# Patient Record
Sex: Female | Born: 1985 | Race: Black or African American | Hispanic: No | Marital: Single | Smoking: Current every day smoker
Health system: Southern US, Community
[De-identification: ages and names within clinical notes are randomized; demographics above are authoritative.]

## PROBLEM LIST (undated history)

## (undated) ENCOUNTER — Inpatient Hospital Stay (HOSPITAL_COMMUNITY): Payer: Self-pay

## (undated) DIAGNOSIS — E669 Obesity, unspecified: Secondary | ICD-10-CM

## (undated) DIAGNOSIS — IMO0002 Reserved for concepts with insufficient information to code with codable children: Secondary | ICD-10-CM

## (undated) DIAGNOSIS — F32A Depression, unspecified: Secondary | ICD-10-CM

## (undated) DIAGNOSIS — F329 Major depressive disorder, single episode, unspecified: Secondary | ICD-10-CM

## (undated) DIAGNOSIS — R011 Cardiac murmur, unspecified: Secondary | ICD-10-CM

## (undated) DIAGNOSIS — R87619 Unspecified abnormal cytological findings in specimens from cervix uteri: Secondary | ICD-10-CM

## (undated) DIAGNOSIS — K219 Gastro-esophageal reflux disease without esophagitis: Secondary | ICD-10-CM

## (undated) DIAGNOSIS — F419 Anxiety disorder, unspecified: Secondary | ICD-10-CM

## (undated) DIAGNOSIS — D649 Anemia, unspecified: Secondary | ICD-10-CM

## (undated) HISTORY — PX: DILATION AND CURETTAGE OF UTERUS: SHX78

## (undated) HISTORY — DX: Obesity, unspecified: E66.9

---
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
## 2005-02-20 ENCOUNTER — Ambulatory Visit (HOSPITAL_COMMUNITY): Admission: RE | Admit: 2005-02-20 | Discharge: 2005-02-20 | Payer: Self-pay | Admitting: Obstetrics and Gynecology

## 2005-06-30 ENCOUNTER — Inpatient Hospital Stay (HOSPITAL_COMMUNITY): Admission: AD | Admit: 2005-06-30 | Discharge: 2005-07-02 | Payer: Self-pay | Admitting: Obstetrics and Gynecology

## 2005-07-08 ENCOUNTER — Inpatient Hospital Stay (HOSPITAL_COMMUNITY): Admission: AD | Admit: 2005-07-08 | Discharge: 2005-07-08 | Payer: Self-pay | Admitting: Obstetrics and Gynecology

## 2005-08-11 ENCOUNTER — Other Ambulatory Visit: Admission: RE | Admit: 2005-08-11 | Discharge: 2005-08-11 | Payer: Self-pay | Admitting: Obstetrics and Gynecology

## 2005-10-08 ENCOUNTER — Emergency Department (HOSPITAL_COMMUNITY): Admission: EM | Admit: 2005-10-08 | Discharge: 2005-10-08 | Payer: Self-pay | Admitting: Family Medicine

## 2006-05-01 ENCOUNTER — Emergency Department (HOSPITAL_COMMUNITY): Admission: EM | Admit: 2006-05-01 | Discharge: 2006-05-01 | Payer: Self-pay | Admitting: Family Medicine

## 2006-08-06 ENCOUNTER — Other Ambulatory Visit: Admission: RE | Admit: 2006-08-06 | Discharge: 2006-08-06 | Payer: Self-pay | Admitting: Obstetrics and Gynecology

## 2006-11-07 ENCOUNTER — Emergency Department (HOSPITAL_COMMUNITY): Admission: EM | Admit: 2006-11-07 | Discharge: 2006-11-07 | Payer: Self-pay | Admitting: Family Medicine

## 2006-12-27 ENCOUNTER — Other Ambulatory Visit: Admission: RE | Admit: 2006-12-27 | Discharge: 2006-12-27 | Payer: Self-pay | Admitting: Obstetrics and Gynecology

## 2006-12-29 ENCOUNTER — Inpatient Hospital Stay (HOSPITAL_COMMUNITY): Admission: AD | Admit: 2006-12-29 | Discharge: 2006-12-29 | Payer: Self-pay | Admitting: Obstetrics and Gynecology

## 2007-07-18 ENCOUNTER — Inpatient Hospital Stay (HOSPITAL_COMMUNITY): Admission: AD | Admit: 2007-07-18 | Discharge: 2007-07-18 | Payer: Self-pay | Admitting: Obstetrics & Gynecology

## 2007-08-09 ENCOUNTER — Ambulatory Visit (HOSPITAL_COMMUNITY): Admission: RE | Admit: 2007-08-09 | Discharge: 2007-08-09 | Payer: Self-pay | Admitting: Obstetrics & Gynecology

## 2007-08-23 ENCOUNTER — Ambulatory Visit (HOSPITAL_COMMUNITY): Admission: RE | Admit: 2007-08-23 | Discharge: 2007-08-23 | Payer: Self-pay | Admitting: Obstetrics & Gynecology

## 2007-09-02 ENCOUNTER — Inpatient Hospital Stay (HOSPITAL_COMMUNITY): Admission: AD | Admit: 2007-09-02 | Discharge: 2007-09-02 | Payer: Self-pay | Admitting: Obstetrics & Gynecology

## 2007-09-12 ENCOUNTER — Other Ambulatory Visit: Payer: Self-pay | Admitting: Emergency Medicine

## 2007-09-13 ENCOUNTER — Inpatient Hospital Stay (HOSPITAL_COMMUNITY): Admission: AD | Admit: 2007-09-13 | Discharge: 2007-09-13 | Payer: Self-pay | Admitting: Obstetrics & Gynecology

## 2007-09-13 ENCOUNTER — Other Ambulatory Visit: Payer: Self-pay | Admitting: Emergency Medicine

## 2007-11-05 ENCOUNTER — Ambulatory Visit (HOSPITAL_COMMUNITY): Admission: RE | Admit: 2007-11-05 | Discharge: 2007-11-05 | Payer: Self-pay | Admitting: Obstetrics & Gynecology

## 2007-12-13 ENCOUNTER — Inpatient Hospital Stay (HOSPITAL_COMMUNITY): Admission: AD | Admit: 2007-12-13 | Discharge: 2007-12-13 | Payer: Self-pay | Admitting: Obstetrics & Gynecology

## 2008-01-01 ENCOUNTER — Inpatient Hospital Stay (HOSPITAL_COMMUNITY): Admission: AD | Admit: 2008-01-01 | Discharge: 2008-01-04 | Payer: Self-pay | Admitting: Obstetrics & Gynecology

## 2008-02-16 ENCOUNTER — Emergency Department (HOSPITAL_COMMUNITY): Admission: EM | Admit: 2008-02-16 | Discharge: 2008-02-16 | Payer: Self-pay | Admitting: Family Medicine

## 2008-07-20 ENCOUNTER — Emergency Department (HOSPITAL_COMMUNITY): Admission: EM | Admit: 2008-07-20 | Discharge: 2008-07-20 | Payer: Self-pay | Admitting: Emergency Medicine

## 2010-01-07 ENCOUNTER — Emergency Department (HOSPITAL_COMMUNITY): Admission: EM | Admit: 2010-01-07 | Discharge: 2010-01-07 | Payer: Self-pay | Admitting: Family Medicine

## 2011-02-21 NOTE — H&P (Signed)
NAME:  Robin Hodge, Robin Hodge NO.:  [id number]   MEDICAL RECORD NO.:  [id number]          PATIENT TYPE:  INP   LOCATION:  9111                          FACILITY:  [facility]   PHYSICIAN:  Roseanna Rainbow, M.D.DATE OF BIRTH:  10-04-1986   DATE OF ADMISSION:  01/01/2008  DATE OF DISCHARGE:                              HISTORY & PHYSICAL   CHIEF COMPLAINT:  The patient is a 25 year old, para 1 with an estimated  date of confinement of April 2 for an elective induction of labor.   HISTORY OF PRESENT ILLNESS:  Please see the above.   ALLERGIES:  No known drug allergies.   MEDICATIONS:  Prenatal vitamins.   OB RISK FACTORS:  Urinary tract infection, Rh negative, non-sensitized  obesity. History of chronic hypertension.   PRENATAL SCREENS:  Chlamydia probe negative.  Urine culture and  sensitivity October 2008, Proteus mirabilis, quad screen negative.  Pap  smear negative.  GC probe negative. Hepatitis B surface antigen  negative, hematocrit 33.8, hemoglobin 10.5, HIV nonreactive.  Blood type  is O negative, antibody screen negative, RPR nonreactive, rubella  immune. Sickle cell negative.  Varicella immune.  GBS negative on March  6.   PAST OBSTETRICAL HISTORY:  There is a history of a spontaneous abortion.  In September 2006 she was delivered of delivered of a live born female  post dates 7 pounds 12 ounces vaginal delivery.   PAST GYN HISTORY:  Noncontributory.   PAST MEDICAL HISTORY:  Iron deficiency anemia.  Hypertension.  No  medications at present.   PAST SURGICAL HISTORY:  No previous surgery.   SOCIAL HISTORY:  She is employed as a Conservation officer, nature at [employer] and she is a  Consulting civil engineer.  She is single, does not give any significant history of  alcohol usage, has no significant smoking history.  Denies illicit drug  use. There is family history of heart disease, diabetes, hypertension.   PHYSICAL EXAM:  VITAL SIGNS:  Stable, afebrile.  Fetal heart tracing  reassuring.   Tocodynamometer irregular, uterine contractions. Sterile  vaginal exam. Per the RN, the cervix is 1-2 cm dilated, 60-70% effaced  with the vertex at a -2 station. .   ASSESSMENT:  Primipara at term with a history of chronic hypertension on  no medications at present.  Favorable Bishop score.  Fetal heart tracing  consistent with fetal well-being.   PLAN:  Low-dose Pitocin per protocol.      Roseanna Rainbow, M.D.  Electronically Signed     LAJ/MEDQ  D:  01/01/2008  T:  01/02/2008  Job:  [id number]

## 2011-02-24 NOTE — Discharge Summary (Signed)
NAME:  Robin Hodge, Robin Hodge NO.:  [id number]   MEDICAL RECORD NO.:  [id number]          PATIENT TYPE:  INP   LOCATION:  9103                          FACILITY:  [facility]   PHYSICIAN:  Rudy Jew. Ashley Royalty, M.D.DATE OF BIRTH:  01-Aug-1986   DATE OF ADMISSION:  06/30/2005  DATE OF DISCHARGE:  07/02/2005                                 DISCHARGE SUMMARY   DISCHARGE DIAGNOSES:  1.  Intrauterine pregnancy at term, delivered.  2.  Labor.  3.  Term born living child, vertex.   OPERATIONS AND SPECIAL PROCEDURES:  OB deliver with right vaginal laceration  per Dr. Mia Creek.   CONSULTATIONS:  None.   DISCHARGE MEDICATIONS:  Vicodin.   HISTORY AND PHYSICAL:  An 25 year old primigravida, [facility] June 25, 2005,  presenting complaining of contractions.  Initial cervical examination was 6  cm, 90%, -2 station, vertex presentation.  For the remainder of the History  and Physical, please see chart.   HOSPITAL COURSE:  The patient was admitted to [facility].  Admission laboratory tests were drawn.  On June 30, 2005,  she delivered a 7 pound 14 ounce female, Apgars 9 at 1 minute and 9 at 5  minutes, sent to newborn nursery.  Delivery was accomplished per Dr. Alfonzo Feller over an intact perineum. The patient did sustain a right vaginal  laceration which was repaired without difficulty.  The patient's postpartum  course was benign.  She was discharged on second postpartum day afebrile and  in satisfactory condition.   DISPOSITION:  The patient will return to [facility]  in 4 to 6 weeks for postpartum evaluation.           ______________________________  Rudy Jew Ashley Royalty, M.D.     JAM/MEDQ  D:  08/01/2005  T:  08/01/2005  Job:  [id number]

## 2011-05-09 ENCOUNTER — Emergency Department (HOSPITAL_COMMUNITY)
Admission: EM | Admit: 2011-05-09 | Discharge: 2011-05-09 | Disposition: A | Discharge status: Home / Self Care | Payer: Self-pay | Attending: Emergency Medicine | Admitting: Emergency Medicine

## 2011-05-09 ENCOUNTER — Emergency Department (HOSPITAL_COMMUNITY): Payer: Self-pay

## 2011-05-09 DIAGNOSIS — R42 Dizziness and giddiness: Secondary | ICD-10-CM | POA: Insufficient documentation

## 2011-05-09 DIAGNOSIS — R509 Fever, unspecified: Secondary | ICD-10-CM | POA: Insufficient documentation

## 2011-05-09 DIAGNOSIS — J45909 Unspecified asthma, uncomplicated: Secondary | ICD-10-CM | POA: Insufficient documentation

## 2011-05-09 DIAGNOSIS — R05 Cough: Secondary | ICD-10-CM | POA: Insufficient documentation

## 2011-05-09 DIAGNOSIS — R0602 Shortness of breath: Secondary | ICD-10-CM | POA: Insufficient documentation

## 2011-05-09 DIAGNOSIS — J3489 Other specified disorders of nose and nasal sinuses: Secondary | ICD-10-CM | POA: Insufficient documentation

## 2011-05-09 DIAGNOSIS — R079 Chest pain, unspecified: Secondary | ICD-10-CM | POA: Insufficient documentation

## 2011-05-09 DIAGNOSIS — R059 Cough, unspecified: Secondary | ICD-10-CM | POA: Insufficient documentation

## 2011-05-09 DIAGNOSIS — J40 Bronchitis, not specified as acute or chronic: Secondary | ICD-10-CM | POA: Insufficient documentation

## 2011-05-14 ENCOUNTER — Emergency Department (HOSPITAL_COMMUNITY)
Admission: EM | Admit: 2011-05-14 | Discharge: 2011-05-15 | Disposition: A | Discharge status: Home / Self Care | Payer: Self-pay | Attending: Emergency Medicine | Admitting: Emergency Medicine

## 2011-05-14 DIAGNOSIS — L03019 Cellulitis of unspecified finger: Secondary | ICD-10-CM | POA: Insufficient documentation

## 2011-05-14 DIAGNOSIS — M7989 Other specified soft tissue disorders: Secondary | ICD-10-CM | POA: Insufficient documentation

## 2011-05-14 DIAGNOSIS — M79609 Pain in unspecified limb: Secondary | ICD-10-CM | POA: Insufficient documentation

## 2011-07-03 LAB — CBC
HCT: 29 — ABNORMAL LOW
MCHC: 32.9
MCV: 71.4 — ABNORMAL LOW
RBC: 3.72 — ABNORMAL LOW
RBC: 4
RDW: 18.5 — ABNORMAL HIGH

## 2011-07-03 LAB — RH IMMUNE GLOBULIN WORKUP (NOT WOMEN'S HOSP): ABO/RH(D): O NEG

## 2011-07-03 LAB — RH IMMUNE GLOB WKUP(>/=20WKS)(NOT WOMEN'S HOSP): Fetal Screen: NEGATIVE

## 2011-07-03 LAB — RPR: RPR Ser Ql: NONREACTIVE

## 2011-07-17 LAB — KLEIHAUER-BETKE STAIN: Fetal Cells %: 0

## 2011-07-18 LAB — URINE MICROSCOPIC-ADD ON

## 2011-07-18 LAB — URINALYSIS, ROUTINE W REFLEX MICROSCOPIC
Ketones, ur: NEGATIVE
Protein, ur: 100 — AB
Urobilinogen, UA: 0.2

## 2011-07-18 LAB — URINE CULTURE: Colony Count: >=100000

## 2011-07-18 LAB — WET PREP, GENITAL
Clue Cells Wet Prep HPF POC: NONE SEEN
Trich, Wet Prep: NONE SEEN
Yeast Wet Prep HPF POC: NONE SEEN

## 2011-07-20 LAB — GC/CHLAMYDIA PROBE AMP, GENITAL: Chlamydia, DNA Probe: NEGATIVE

## 2011-07-20 LAB — URINALYSIS, ROUTINE W REFLEX MICROSCOPIC
Bilirubin Urine: NEGATIVE
Glucose, UA: NEGATIVE
Hgb urine dipstick: NEGATIVE
Ketones, ur: NEGATIVE
Protein, ur: NEGATIVE
Urobilinogen, UA: 0.2

## 2011-07-20 LAB — WET PREP, GENITAL: WBC, Wet Prep HPF POC: NONE SEEN

## 2011-08-02 ENCOUNTER — Inpatient Hospital Stay (HOSPITAL_COMMUNITY)
Admission: AD | Admit: 2011-08-02 | Discharge: 2011-08-03 | Disposition: A | Discharge status: Home / Self Care | Payer: Self-pay | Source: Ambulatory Visit | Attending: Obstetrics and Gynecology | Admitting: Obstetrics and Gynecology

## 2011-08-02 ENCOUNTER — Encounter (HOSPITAL_COMMUNITY): Payer: Self-pay | Admitting: *Deleted

## 2011-08-02 DIAGNOSIS — A499 Bacterial infection, unspecified: Secondary | ICD-10-CM

## 2011-08-02 DIAGNOSIS — B9689 Other specified bacterial agents as the cause of diseases classified elsewhere: Secondary | ICD-10-CM | POA: Insufficient documentation

## 2011-08-02 DIAGNOSIS — N760 Acute vaginitis: Secondary | ICD-10-CM | POA: Insufficient documentation

## 2011-08-02 DIAGNOSIS — R300 Dysuria: Secondary | ICD-10-CM | POA: Insufficient documentation

## 2011-08-02 LAB — URINALYSIS, ROUTINE W REFLEX MICROSCOPIC
Glucose, UA: NEGATIVE mg/dL
Hgb urine dipstick: NEGATIVE
Specific Gravity, Urine: >1.03 — ABNORMAL HIGH (ref 1.005–1.030)
pH: 5.5 (ref 5.0–8.0)

## 2011-08-02 NOTE — Progress Notes (Signed)
Pt reports frequency x 1 week, pain and pressure with urination. Vaginal discharge x 2 weeks, mucus with odor

## 2011-08-02 NOTE — ED Provider Notes (Signed)
History   Pt presents today c/o dysuria and vag dc for 1wk. She states the pain has worsened over the past several days. She denies fever, vag bleeding, abd pain, or any other sx. Her last episode of intercourse was 1wk ago.  Chief Complaint  Patient presents with  . Dysuria   HPI  OB History    Grav Para Term Preterm Abortions TAB SAB Ect Mult Living                  History reviewed. No pertinent past medical history.  No past surgical history on file.  No family history on file.  History  Substance Use Topics  . Smoking status: Not on file  . Smokeless tobacco: Not on file  . Alcohol Use: Not on file    Allergies: Allergies not on file  No prescriptions prior to admission    Review of Systems  Constitutional: Negative for fever.  Cardiovascular: Negative for chest pain.  Gastrointestinal: Negative for nausea, vomiting, abdominal pain, diarrhea, constipation and blood in stool.  Genitourinary: Positive for dysuria. Negative for urgency, frequency, hematuria and flank pain.  Neurological: Negative for dizziness and headaches.  Psychiatric/Behavioral: Negative for depression and suicidal ideas.   Physical Exam   Blood pressure 142/66, pulse 84, temperature 98.7 F (37.1 C), resp. rate 20, height 5' 7.5" (1.715 m), weight 309 lb (140.161 kg), last menstrual period 07/20/2011, SpO2 99.00%.  Physical Exam  Nursing note and vitals reviewed. Constitutional: She is oriented to person, place, and time. She appears well-developed and well-nourished. No distress.  HENT:  Head: Normocephalic and atraumatic.  Eyes: EOM are normal. Pupils are equal, round, and reactive to light.  GI: Soft. She exhibits no distension. There is no tenderness. There is no rebound and no guarding.  Genitourinary: No bleeding around the vagina. Vaginal discharge found.       Bimanual exam difficult secondary to morbid obesity.  Neurological: She is alert and oriented to person, place, and time.    Skin: Skin is warm and dry. She is not diaphoretic.  Psychiatric: She has a normal mood and affect. Her behavior is normal. Judgment and thought content normal.    MAU Course  Procedures  Results for orders placed during the hospital encounter of 08/02/11 (from the past 24 hour(s))  URINALYSIS, ROUTINE W REFLEX MICROSCOPIC     Status: Abnormal   Collection Time   08/02/11 10:30 PM      Component Value Range   Color, Urine YELLOW  YELLOW    Appearance CLEAR  CLEAR    Specific Gravity, Urine >1.030 (*) 1.005 - 1.030    pH 5.5  5.0 - 8.0    Glucose, UA NEGATIVE  NEGATIVE (mg/dL)   Hgb urine dipstick NEGATIVE  NEGATIVE    Bilirubin Urine NEGATIVE  NEGATIVE    Ketones, ur NEGATIVE  NEGATIVE (mg/dL)   Protein, ur NEGATIVE  NEGATIVE (mg/dL)   Urobilinogen, UA 0.2  0.0 - 1.0 (mg/dL)   Nitrite NEGATIVE  NEGATIVE    Leukocytes, UA NEGATIVE  NEGATIVE   WET PREP, GENITAL     Status: Abnormal   Collection Time   08/02/11 11:45 PM      Component Value Range   Yeast, Wet Prep NONE SEEN  NONE SEEN    Trich, Wet Prep NONE SEEN  NONE SEEN    Clue Cells, Wet Prep MANY (*) NONE SEEN    WBC, Wet Prep HPF POC MODERATE (*) NONE SEEN  Assessment and Plan  BV: discussed with pt at length. Will tx with Flagyl. Warned of antabuse reaction. She will f/u with her PCP. Discussed diet, activity, risks, and precautions.  Clinton Gallant. Rice III, DrHSc, MPAS, PA-C  08/02/2011, 11:44 PM   Henrietta Hoover, PA 08/03/11 0011

## 2011-08-03 ENCOUNTER — Encounter (HOSPITAL_COMMUNITY): Payer: Self-pay | Admitting: *Deleted

## 2011-08-03 LAB — WET PREP, GENITAL: Trich, Wet Prep: NONE SEEN

## 2011-08-03 LAB — GC/CHLAMYDIA PROBE AMP, GENITAL: GC Probe Amp, Genital: NEGATIVE

## 2011-08-03 MED ORDER — METRONIDAZOLE 500 MG PO TABS: 500.0000 mg | ORAL_TABLET | Freq: Two times a day (BID) | ORAL | Status: AC

## 2011-09-08 ENCOUNTER — Emergency Department (HOSPITAL_COMMUNITY)
Admission: EM | Admit: 2011-09-08 | Discharge: 2011-09-08 | Disposition: A | Discharge status: Home / Self Care | Payer: Self-pay | Attending: Emergency Medicine | Admitting: Emergency Medicine

## 2011-09-08 ENCOUNTER — Encounter (HOSPITAL_COMMUNITY): Payer: Self-pay | Admitting: Emergency Medicine

## 2011-09-08 DIAGNOSIS — R059 Cough, unspecified: Secondary | ICD-10-CM | POA: Insufficient documentation

## 2011-09-08 DIAGNOSIS — IMO0001 Reserved for inherently not codable concepts without codable children: Secondary | ICD-10-CM | POA: Insufficient documentation

## 2011-09-08 DIAGNOSIS — R220 Localized swelling, mass and lump, head: Secondary | ICD-10-CM | POA: Insufficient documentation

## 2011-09-08 DIAGNOSIS — R221 Localized swelling, mass and lump, neck: Secondary | ICD-10-CM | POA: Insufficient documentation

## 2011-09-08 DIAGNOSIS — R05 Cough: Secondary | ICD-10-CM | POA: Insufficient documentation

## 2011-09-08 DIAGNOSIS — J069 Acute upper respiratory infection, unspecified: Secondary | ICD-10-CM

## 2011-09-08 DIAGNOSIS — J3489 Other specified disorders of nose and nasal sinuses: Secondary | ICD-10-CM | POA: Insufficient documentation

## 2011-09-08 DIAGNOSIS — R070 Pain in throat: Secondary | ICD-10-CM | POA: Insufficient documentation

## 2011-09-08 MED ORDER — IBUPROFEN 800 MG PO TABS
800.0000 mg | ORAL_TABLET | Freq: Once | ORAL | Status: AC
  Administered 2011-09-08: 800 mg via ORAL
  Filled 2011-09-08 (×2): qty 1

## 2011-09-08 NOTE — ED Notes (Signed)
Pt c/o generalized body aches x several days; pt sts some head congestion and cough; pt denies fever

## 2011-09-08 NOTE — ED Provider Notes (Signed)
History     CSN: [id number] Arrival date & time: 09/08/2011 10:45 AM   First MD Initiated Contact with Patient 09/08/11 1153      Chief Complaint  Patient presents with  . Generalized Body Aches    (Consider location/radiation/quality/duration/timing/severity/associated sxs/prior treatment) Patient is a 25 y.o. female presenting with URI. The history is provided by the patient.  URI The primary symptoms include sore throat, cough and myalgias. Primary symptoms do not include fever, headaches, abdominal pain, nausea, vomiting, arthralgias or rash. The current episode started yesterday. This is a new problem.  The sore throat began yesterday. The sore throat has been unchanged since its onset. The sore throat is moderate in intensity. The sore throat is not accompanied by trouble swallowing, drooling, hoarse voice or stridor.  The cough is non-productive.  Myalgias began yesterday. The myalgias have been unchanged since their onset. The myalgias are generalized. The discomfort from the myalgias is moderate. The myalgias are not associated with weakness or swelling. Risk factors for myalgias include a change in medications.  The onset of the illness is associated with exposure to sick contacts and recent travel. Symptoms associated with the illness include facial pain, sinus pressure, congestion and rhinorrhea. The illness is not associated with chills. The following treatments were addressed: Acetaminophen was ineffective. A decongestant was ineffective.    Past Medical History  Diagnosis Date  . No pertinent past medical history     Past Surgical History  Procedure Date  . No past surgeries     History reviewed. No pertinent family history.  History  Substance Use Topics  . Smoking status: Never Smoker   . Smokeless tobacco: Not on file  . Alcohol Use: No    OB History    Grav Para Term Preterm Abortions TAB SAB Ect Mult Living   3 2        2       Review of Systems    Constitutional: Negative for fever and chills.  HENT: Positive for congestion, sore throat, rhinorrhea and sinus pressure. Negative for hoarse voice, drooling and trouble swallowing.   Respiratory: Positive for cough. Negative for stridor.   Cardiovascular: Negative for chest pain.  Gastrointestinal: Negative for nausea, vomiting and abdominal pain.  Musculoskeletal: Positive for myalgias. Negative for arthralgias.  Skin: Negative for rash.  Neurological: Negative for weakness and headaches.    Allergies  Review of patient's allergies indicates no known allergies.  Home Medications  No current outpatient prescriptions on file.  BP 133/74  Pulse 92  Temp(Src) 98.6 F (37 C) (Oral)  Resp 16  SpO2 99%  Physical Exam  Nursing note and vitals reviewed. Constitutional: She is oriented to person, place, and time. She appears well-developed and well-nourished. No distress.  HENT:  Head: Normocephalic and atraumatic.  Right Ear: External ear normal.  Left Ear: External ear normal.  Nose: Mucosal edema and rhinorrhea present. No nasal deformity or nasal septal hematoma. No epistaxis. Right sinus exhibits maxillary sinus tenderness. Left sinus exhibits maxillary sinus tenderness.  Mouth/Throat: No oropharyngeal exudate.  Eyes: Pupils are equal, round, and reactive to light.  Neck: Normal range of motion. Neck supple.  Pulmonary/Chest: Effort normal and breath sounds normal.  Abdominal: Soft.  Musculoskeletal: Normal range of motion.  Lymphadenopathy:    She has no cervical adenopathy.  Neurological: She is alert and oriented to person, place, and time.  Skin: Skin is warm and dry. No rash noted. She is not diaphoretic.    ED Course  Procedures (including critical care time)  Labs Reviewed - No data to display No results found.   No diagnosis found.    MDM    Pt is afebrile, no fever at home.  Sat on RA is 99% and normal.  No wheezing, stridor.  Pt told it is a URI,  will need to run course, recommend OTC meds, ibuprofen for achiness and she should feel improved over the weekend, will give work note.          Gavin Pound. Oletta Lamas, MD 09/08/11 1212

## 2012-03-16 ENCOUNTER — Inpatient Hospital Stay (HOSPITAL_COMMUNITY)
Admission: AD | Admit: 2012-03-16 | Discharge: 2012-03-17 | Disposition: A | Discharge status: Home / Self Care | Payer: Medicaid Other | Source: Ambulatory Visit | Attending: Obstetrics & Gynecology | Admitting: Obstetrics & Gynecology

## 2012-03-16 ENCOUNTER — Encounter (HOSPITAL_COMMUNITY): Payer: Self-pay | Admitting: *Deleted

## 2012-03-16 DIAGNOSIS — O26899 Other specified pregnancy related conditions, unspecified trimester: Secondary | ICD-10-CM

## 2012-03-16 DIAGNOSIS — O99891 Other specified diseases and conditions complicating pregnancy: Secondary | ICD-10-CM | POA: Insufficient documentation

## 2012-03-16 DIAGNOSIS — R109 Unspecified abdominal pain: Secondary | ICD-10-CM | POA: Insufficient documentation

## 2012-03-16 LAB — URINALYSIS, ROUTINE W REFLEX MICROSCOPIC
Bilirubin Urine: NEGATIVE
Ketones, ur: NEGATIVE mg/dL
Nitrite: NEGATIVE
Urobilinogen, UA: 0.2 mg/dL (ref 0.0–1.0)

## 2012-03-16 NOTE — MAU Note (Signed)
Pt states abdominal pain started 3 days ago. Pt states it feels like " somebody stab the bottom of her stomach"

## 2012-03-16 NOTE — MAU Note (Signed)
Pt states, " I've had pain in my low abdomen for three days. I sleep most of the day and took tylenol and it helped for a while."

## 2012-03-17 ENCOUNTER — Inpatient Hospital Stay (HOSPITAL_COMMUNITY): Payer: Medicaid Other

## 2012-03-17 DIAGNOSIS — R109 Unspecified abdominal pain: Secondary | ICD-10-CM

## 2012-03-17 DIAGNOSIS — O26899 Other specified pregnancy related conditions, unspecified trimester: Secondary | ICD-10-CM

## 2012-03-17 LAB — CBC
HCT: 27.8 % — ABNORMAL LOW (ref 36.0–46.0)
Hemoglobin: 8.2 g/dL — ABNORMAL LOW (ref 12.0–15.0)
MCH: 19.3 pg — ABNORMAL LOW (ref 26.0–34.0)
MCHC: 29.5 g/dL — ABNORMAL LOW (ref 30.0–36.0)
RBC: 4.24 MIL/uL (ref 3.87–5.11)

## 2012-03-17 NOTE — Discharge Instructions (Signed)
Abdominal Pain During Pregnancy Abdominal discomfort is common in pregnancy. Most of the time, it does not cause harm. There are many causes of abdominal pain. Some causes are more serious than others. Some of the causes of abdominal pain in pregnancy are easily diagnosed. Occasionally, the diagnosis takes time to understand. Other times, the cause is not determined. Abdominal pain can be a sign that something is very wrong with the pregnancy, or the pain may have nothing to do with the pregnancy at all. For this reason, always tell your caregiver if you have any abdominal discomfort. CAUSES Common and harmless causes of abdominal pain include:  Constipation.   Excess gas and bloating.   Round ligament pain. This is pain that is felt in the folds of the groin.   The position the baby or placenta is in.   Baby kicks.   Braxton-Hicks contractions. These are mild contractions that do not cause cervical dilation.  Serious causes of abdominal pain include:  Ectopic pregnancy. This happens when a fertilized egg implants outside of the uterus.   Miscarriage.   Preterm labor. This is when labor starts at less than 37 weeks of pregnancy.   Placental abruption. This is when the placenta partially or completely separates from the uterus.   Preeclampsia. This is often associated with high blood pressure and has been referred to as "toxemia in pregnancy."   Uterine or amniotic fluid infections.  Causes unrelated to pregnancy include:  Urinary tract infection.   Gallbladder stones or inflammation.   Hepatitis or other liver illness.   Intestinal problems, stomach flu, food poisoning, or ulcer.   Appendicitis.   Kidney (renal) stones.   Kidney infection (pylonephritis).  HOME CARE INSTRUCTIONS  For mild pain:  Do not have sexual intercourse or put anything in your vagina until your symptoms go away completely.   Get plenty of rest until your pain improves. If your pain does not  improve in 1 hour, call your caregiver.   Drink clear fluids if you feel nauseous. Avoid solid food as long as you are uncomfortable or nauseous.   Only take medicine as directed by your caregiver.   Keep all follow-up appointments with your caregiver.  SEEK IMMEDIATE MEDICAL CARE IF:  You are bleeding, leaking fluid, or passing tissue from the vagina.   You have increasing pain or cramping.   You have persistent vomiting.   You have painful or bloody urination.   You have a fever.   You notice a decrease in your baby's movements.   You have extreme weakness or feel faint.   You have shortness of breath, with or without abdominal pain.   You develop a severe headache with abdominal pain.   You have abnormal vaginal discharge with abdominal pain.   You have persistent diarrhea.   You have abdominal pain that continues even after rest, or gets worse.  MAKE SURE YOU:   Understand these instructions.   Will watch your condition.   Will get help right away if you are not doing well or get worse.  Document Released: 09/25/2005 Document Revised: 09/14/2011 Document Reviewed: 04/21/2011 ExitCare Patient Information 2012 [employer].Abdominal Pain During Pregnancy Abdominal discomfort is common in pregnancy. Most of the time, it does not cause harm. There are many causes of abdominal pain. Some causes are more serious than others. Some of the causes of abdominal pain in pregnancy are easily diagnosed. Occasionally, the diagnosis takes time to understand. Other times, the cause is not determined.   Abdominal pain can be a sign that something is very wrong with the pregnancy, or the pain may have nothing to do with the pregnancy at all. For this reason, always tell your caregiver if you have any abdominal discomfort. CAUSES Common and harmless causes of abdominal pain include:  Constipation.   Excess gas and bloating.   Round ligament pain. This is pain that is felt in the  folds of the groin.   The position the baby or placenta is in.   Baby kicks.   Braxton-Hicks contractions. These are mild contractions that do not cause cervical dilation.  Serious causes of abdominal pain include:  Ectopic pregnancy. This happens when a fertilized egg implants outside of the uterus.   Miscarriage.   Preterm labor. This is when labor starts at less than 37 weeks of pregnancy.   Placental abruption. This is when the placenta partially or completely separates from the uterus.   Preeclampsia. This is often associated with high blood pressure and has been referred to as "toxemia in pregnancy."   Uterine or amniotic fluid infections.  Causes unrelated to pregnancy include:  Urinary tract infection.   Gallbladder stones or inflammation.   Hepatitis or other liver illness.   Intestinal problems, stomach flu, food poisoning, or ulcer.   Appendicitis.   Kidney (renal) stones.   Kidney infection (pylonephritis).  HOME CARE INSTRUCTIONS  For mild pain:  Do not have sexual intercourse or put anything in your vagina until your symptoms go away completely.   Get plenty of rest until your pain improves. If your pain does not improve in 1 hour, call your caregiver.   Drink clear fluids if you feel nauseous. Avoid solid food as long as you are uncomfortable or nauseous.   Only take medicine as directed by your caregiver.   Keep all follow-up appointments with your caregiver.  SEEK IMMEDIATE MEDICAL CARE IF:  You are bleeding, leaking fluid, or passing tissue from the vagina.   You have increasing pain or cramping.   You have persistent vomiting.   You have painful or bloody urination.   You have a fever.   You notice a decrease in your baby's movements.   You have extreme weakness or feel faint.   You have shortness of breath, with or without abdominal pain.   You develop a severe headache with abdominal pain.   You have abnormal vaginal discharge  with abdominal pain.   You have persistent diarrhea.   You have abdominal pain that continues even after rest, or gets worse.  MAKE SURE YOU:   Understand these instructions.   Will watch your condition.   Will get help right away if you are not doing well or get worse.  Document Released: 09/25/2005 Document Revised: 09/14/2011 Document Reviewed: 04/21/2011 ExitCare Patient Information 2012 [employer]. 

## 2012-03-17 NOTE — MAU Provider Note (Signed)
Robin Hodge y.o.G3P2 @4 .3 weeks by LMP Chief Complaint  Patient presents with  . Abdominal Pain     First Provider Initiated Contact with Patient 03/17/12 0105      SUBJECTIVE  HPI: HPI: Robin Hodge is a 26 y.o. year old G3P2 female at 4.[redacted] weeks gestation who presents to MAU reporting intermittent, sharp low abd pain x 3 days. Denies VB, vaginal discharge, urinary Sx, GI complaints. Has not taken UPT. Patient's last menstrual period was 02/15/2012.    Past Medical History  Diagnosis Date  . No pertinent past medical history    Past Surgical History  Procedure Date  . No past surgeries    History   Social History  . Marital Status: Single    Spouse Name: N/A    Number of Children: N/A  . Years of Education: N/A   Occupational History  . Not on file.   Social History Main Topics  . Smoking status: Never Smoker   . Smokeless tobacco: Not on file  . Alcohol Use: No  . Drug Use: No  . Sexually Active: Yes    Birth Control/ Protection: None   Other Topics Concern  . Not on file   Social History Narrative  . No narrative on file   No current facility-administered medications on file prior to encounter.   No current outpatient prescriptions on file prior to encounter.   No Known Allergies  ROS: Pertinent items in HPI  OBJECTIVE Blood pressure 137/86, pulse 106, temperature 98.7 F (37.1 C), temperature source Oral, resp. rate 20, height 5\' 7"  (1.702 m), weight 145.605 kg (321 lb), last menstrual period 02/15/2012.  GENERAL: Well-developed, well-nourished female in no acute distress.  HEENT: Normocephalic, good dentition HEART: normal rate RESP: normal effort ABDOMEN: Soft, nontender EXTREMITIES: Nontender, no edema NEURO: Alert and oriented SPECULUM EXAM: Declined  LAB RESULTS Results for orders placed during the hospital encounter of 03/16/12 (from the past 24 hour(s))  URINALYSIS, ROUTINE W REFLEX MICROSCOPIC     Status: Abnormal   Collection Time    03/16/12 11:20 PM      Component Value Range   Color, Urine YELLOW  YELLOW    APPearance CLEAR  CLEAR    Specific Gravity, Urine >1.030 (*) 1.005 - 1.030    pH 6.0  5.0 - 8.0    Glucose, UA NEGATIVE  NEGATIVE (mg/dL)   Hgb urine dipstick NEGATIVE  NEGATIVE    Bilirubin Urine NEGATIVE  NEGATIVE    Ketones, ur NEGATIVE  NEGATIVE (mg/dL)   Protein, ur NEGATIVE  NEGATIVE (mg/dL)   Urobilinogen, UA 0.2  0.0 - 1.0 (mg/dL)   Nitrite NEGATIVE  NEGATIVE    Leukocytes, UA NEGATIVE  NEGATIVE   POCT PREGNANCY, URINE     Status: Abnormal   Collection Time   03/16/12 11:28 PM      Component Value Range   Preg Test, Ur POSITIVE (*) NEGATIVE   HCG, QUANTITATIVE, PREGNANCY     Status: Abnormal   Collection Time   03/17/12  1:12 AM      Component Value Range   hCG, Beta Chain, Quant, S 392 (*) <5 (mIU/mL)  ABO/RH     Status: Normal   Collection Time   03/17/12  1:12 AM      Component Value Range   ABO/RH(D) O NEG    CBC     Status: Abnormal   Collection Time   03/17/12  1:12 AM      Component Value  Range   WBC 7.2  4.0 - 10.5 (K/uL)   RBC 4.24  3.87 - 5.11 (MIL/uL)   Hemoglobin 8.2 (*) 12.0 - 15.0 (g/dL)   HCT 96.2 (*) 95.2 - 46.0 (%)   MCV 65.6 (*) 78.0 - 100.0 (fL)   MCH 19.3 (*) 26.0 - 34.0 (pg)   MCHC 29.5 (*) 30.0 - 36.0 (g/dL)   RDW 84.1 (*) 32.4 - 15.5 (%)   Platelets 310  150 - 400 (K/uL)   IMAGING US Ob Comp Less 14 Wks  03/17/2012  *RADIOLOGY REPORT*  Clinical Data: Early pregnancy, abdominal pain.  OBSTETRIC <14 WK ULTRASOUND  Technique:  Transabdominal ultrasound was performed for evaluation of the gestation as well as the maternal uterus and adnexal regions.  Comparison:  None.  Intrauterine gestational sac: Not identified  Maternal uterus/Adnexae: No subchorionic hemorrhage. Cystic focus along the lower uterine segment is nonspecific.  Normal sonographic appearance to the ovaries.  Corpus luteal cyst noted on the right.  Trace free fluid.  IMPRESSION: No definite intrauterine  gestation identified.  This may be due to the early timing of the examination.  Recommend correlation with serial quantitative beta HCG levels and ultrasound follow-up as warranted.  Trace free fluid within the pelvis.  Original Report Authenticated By: Waneta Martins, M.D.   US Ob Transvaginal  03/17/2012  *RADIOLOGY REPORT*  Clinical Data: Early pregnancy, abdominal pain.  OBSTETRIC <14 WK ULTRASOUND  Technique:  Transabdominal ultrasound was performed for evaluation of the gestation as well as the maternal uterus and adnexal regions.  Comparison:  None.  Intrauterine gestational sac: Not identified  Maternal uterus/Adnexae: No subchorionic hemorrhage. Cystic focus along the lower uterine segment is nonspecific.  Normal sonographic appearance to the ovaries.  Corpus luteal cyst noted on the right.  Trace free fluid.  IMPRESSION: No definite intrauterine gestation identified.  This may be due to the early timing of the examination.  Recommend correlation with serial quantitative beta HCG levels and ultrasound follow-up as warranted.  Trace free fluid within the pelvis.  Original Report Authenticated By: Waneta Martins, M.D.    ASSESSMENT 1. Abdominal pain complicating pregnancy   Early pregnancy, IUP not confirmed  PLAN D/C home Pt declined pelvic exam. Thanks she may be having gas pains. SAB and ectopic precautions Follow-up Information    Follow up with [facility] in 2 days. (or as needed if symptoms worsen)    Contact information:   [address] [place] [address] [phone]        Medication List    Notice       You have not been prescribed any medications.            Dorathy Kinsman 03/17/2012 2:49 AM

## 2012-03-18 NOTE — MAU Provider Note (Signed)
Attestation of Attending Supervision of Advanced Practitioner: Evaluation and management procedures were performed by the OB Fellow/PA/CNM/NP under my supervision and collaboration. Chart reviewed, and agree with management and plan.  Kyston Gonce, M.D. 03/18/2012 7:51 AM   

## 2012-04-03 ENCOUNTER — Inpatient Hospital Stay (HOSPITAL_COMMUNITY): Admission: AD | Admit: 2012-04-03 | Payer: Self-pay | Source: Home / Self Care

## 2012-04-03 ENCOUNTER — Inpatient Hospital Stay (HOSPITAL_COMMUNITY)
Admission: AD | Admit: 2012-04-03 | Discharge: 2012-04-03 | Disposition: A | Discharge status: Home / Self Care | Payer: Medicaid Other | Source: Ambulatory Visit | Attending: Obstetrics & Gynecology | Admitting: Obstetrics & Gynecology

## 2012-04-03 ENCOUNTER — Encounter (HOSPITAL_COMMUNITY): Payer: Self-pay | Admitting: *Deleted

## 2012-04-03 DIAGNOSIS — O99891 Other specified diseases and conditions complicating pregnancy: Secondary | ICD-10-CM | POA: Insufficient documentation

## 2012-04-03 DIAGNOSIS — Z3201 Encounter for pregnancy test, result positive: Secondary | ICD-10-CM

## 2012-04-03 HISTORY — DX: Unspecified abnormal cytological findings in specimens from cervix uteri: R87.619

## 2012-04-03 HISTORY — DX: Cardiac murmur, unspecified: R01.1

## 2012-04-03 HISTORY — DX: Reserved for concepts with insufficient information to code with codable children: IMO0002

## 2012-04-03 NOTE — MAU Note (Signed)
When asked where she had been, pt laughed and said at work.  Denies pain or bleeding.

## 2012-04-03 NOTE — MAU Provider Note (Signed)
  History     CSN: [id number]  Arrival date and time: 04/03/12 1259   First Provider Initiated Contact with Patient 04/03/12 1527      Chief Complaint  Patient presents with  . Follow-up   HPI Robin Hodge 26 y.o. [redacted]w[redacted]d  Was seen earlier this month on 03-16-12.  Was to return in 48 hours for repeat quant.  Was busy and not able to return.  Comes today as she needs a pregnancy verification form to apply for Medicaid.    OB History    Grav Para Term Preterm Abortions TAB SAB Ect Mult Living   4 2   1  1   2       Past Medical History  Diagnosis Date  . Heart murmur   . Abnormal Pap smear     colpo-ok since    Past Surgical History  Procedure Date  . Dilation and curettage of uterus     Family History  Problem Relation Age of Onset  . Hypertension Mother   . Diabetes Mother   . Heart disease Mother   . Heart disease Father   . Other Neg Hx     History  Substance Use Topics  . Smoking status: Former Smoker -- 3 years    Types: Cigarettes    Quit date: 03/17/2012  . Smokeless tobacco: Never Used  . Alcohol Use: No    Allergies: No Known Allergies  No prescriptions prior to admission    Review of Systems  Gastrointestinal: Negative for nausea, vomiting and abdominal pain.  Genitourinary:       No vaginal discharge. No vaginal bleeding. No dysuria.   Physical Exam   Blood pressure 134/74, pulse 100, temperature 98.4 F (36.9 C), temperature source Oral, resp. rate 20, last menstrual period 02/15/2012.  Physical Exam  Nursing note and vitals reviewed. Constitutional: She is oriented to person, place, and time. She appears well-developed and well-nourished.  HENT:  Head: Normocephalic.  Eyes: EOM are normal.  Neck: Neck supple.  Musculoskeletal: Normal range of motion.  Neurological: She is alert and oriented to person, place, and time.  Skin: Skin is warm and dry.  Psychiatric: She has a normal mood and affect.    MAU Course   Procedures  MDM Results for orders placed during the hospital encounter of 04/03/12 (from the past 24 hour(s))  HCG, QUANTITATIVE, PREGNANCY     Status: Abnormal   Collection Time   04/03/12  1:15 PM      Component Value Range   hCG, Beta Chain, Quant, S 48429 (*) <5 mIU/mL    Assessment and Plan  Progression of pregnancy No other physical complaints today - no indication for further evaluation.  Plan No smoking, no drugs, no alcohol.  Take a prenatal vitamin one by mouth every day.  Eat small frequent snacks to avoid nausea.  Begin prenatal care as soon as possible. Verification of pregnancy given   Amarya Kuehl 04/03/2012, 3:32 PM

## 2012-04-03 NOTE — Discharge Instructions (Signed)
No smoking, no drugs, no alcohol.  Take a prenatal vitamin one by mouth every day.  Eat small frequent snacks to avoid nausea.  Begin prenatal care as soon as possible.  Prenatal Care [facility]    [facility]  [phone]     Phone: [phone]          [facility]                      [facility]  @[place]     Phone: [phone]  Phone: [phone]         [facility]     Phone: [phone]  Phone: [phone]           [facility] [place]                hone: [phone]  Phone: [phone]         [facility] Dr. Francoise Ceo      Phone: [phone]  Phone: [phone]         [facility]                Phone: [phone]  [facility]   Phone:[phone]    Family [address] [place])          Phone: [phone] [facility]   Phone: [phone]

## 2012-05-03 ENCOUNTER — Inpatient Hospital Stay (HOSPITAL_COMMUNITY)
Admission: AD | Admit: 2012-05-03 | Discharge: 2012-05-04 | Disposition: A | Discharge status: Home / Self Care | Payer: Medicaid Other | Source: Ambulatory Visit | Attending: Obstetrics & Gynecology | Admitting: Obstetrics & Gynecology

## 2012-05-03 ENCOUNTER — Encounter (HOSPITAL_COMMUNITY): Payer: Self-pay

## 2012-05-03 DIAGNOSIS — R109 Unspecified abdominal pain: Secondary | ICD-10-CM | POA: Insufficient documentation

## 2012-05-03 DIAGNOSIS — O219 Vomiting of pregnancy, unspecified: Secondary | ICD-10-CM

## 2012-05-03 DIAGNOSIS — N949 Unspecified condition associated with female genital organs and menstrual cycle: Secondary | ICD-10-CM

## 2012-05-03 DIAGNOSIS — O210 Mild hyperemesis gravidarum: Secondary | ICD-10-CM | POA: Insufficient documentation

## 2012-05-03 DIAGNOSIS — N390 Urinary tract infection, site not specified: Secondary | ICD-10-CM

## 2012-05-03 LAB — URINALYSIS, ROUTINE W REFLEX MICROSCOPIC
Bilirubin Urine: NEGATIVE
Glucose, UA: NEGATIVE mg/dL
Ketones, ur: NEGATIVE mg/dL
Specific Gravity, Urine: >1.03 — ABNORMAL HIGH (ref 1.005–1.030)
pH: 5.5 (ref 5.0–8.0)

## 2012-05-03 LAB — URINE MICROSCOPIC-ADD ON

## 2012-05-03 NOTE — MAU Note (Signed)
I've been sick for couple months. Have nausea every day and can't eat. Been having pain in lower stomach and back for couple days.

## 2012-05-03 NOTE — MAU Note (Signed)
Patient is in c/o back and lower abdominal cramping. She states that she have been nauseated and does not have any anti-emetic medicine. She states that she intends to go to [facility] once her medicaid is approved.

## 2012-05-04 DIAGNOSIS — N949 Unspecified condition associated with female genital organs and menstrual cycle: Secondary | ICD-10-CM

## 2012-05-04 MED ORDER — PROMETHAZINE HCL 25 MG PO TABS: 25.0000 mg | ORAL_TABLET | Freq: Four times a day (QID) | ORAL | Status: DC | PRN

## 2012-05-04 MED ORDER — NITROFURANTOIN MONOHYD MACRO 100 MG PO CAPS: 100.0000 mg | ORAL_CAPSULE | Freq: Two times a day (BID) | ORAL | Status: AC

## 2012-05-04 NOTE — MAU Provider Note (Signed)
History     CSN: [id number]  Arrival date and time: 05/03/12 2244   First Provider Initiated Contact with Patient 05/03/12 2326      Chief Complaint  Patient presents with  . Morning Sickness  . Back Pain  . Abdominal Pain   HPI This is a 26 y.o. female at [redacted]w[redacted]d who presents with c/o nausea and also some lower abdominal and low back pain.  Has no meds for nausea at home. Pain is intermittent, comes and goes on sides of lower abdomen. Denies bleeding.   OB History    Grav Para Term Preterm Abortions TAB SAB Ect Mult Living   4 2   1  1   2       Past Medical History  Diagnosis Date  . Heart murmur   . Abnormal Pap smear     colpo-ok since    Past Surgical History  Procedure Date  . Dilation and curettage of uterus     Family History  Problem Relation Age of Onset  . Hypertension Mother   . Diabetes Mother   . Heart disease Mother   . Heart disease Father   . Other Neg Hx     History  Substance Use Topics  . Smoking status: Former Smoker -- 3 years    Types: Cigarettes    Quit date: 03/17/2012  . Smokeless tobacco: Never Used  . Alcohol Use: No    Allergies: No Known Allergies  No prescriptions prior to admission    Review of Systems  Constitutional: Negative for fever and chills.  Gastrointestinal: Positive for nausea, vomiting and abdominal pain. Negative for diarrhea and constipation.  Genitourinary: Positive for frequency. Negative for dysuria.   Physical Exam   Blood pressure 143/72, pulse 99, temperature 98.6 F (37 C), temperature source Oral, resp. rate 20, height 5\' 7"  (1.702 m), weight 326 lb 12.8 oz (148.236 kg), last menstrual period 02/15/2012.  Physical Exam  Constitutional: She is oriented to person, place, and time. She appears well-developed and well-nourished. No distress.  Cardiovascular: Normal rate.   Respiratory: Effort normal.  GI: Soft. She exhibits no distension and no mass. There is no tenderness. There is no rebound and  no guarding.       Bedside US done.  FHR 160s. Fetus active. Mother pleased  Genitourinary: Vagina normal and uterus normal. No vaginal discharge found.       Cervix:   Long and closed  Musculoskeletal: Normal range of motion.  Neurological: She is alert and oriented to person, place, and time.  Skin: Skin is warm and dry.  Psychiatric: She has a normal mood and affect.   Results for orders placed during the hospital encounter of 05/03/12 (from the past 24 hour(s))  URINALYSIS, ROUTINE W REFLEX MICROSCOPIC     Status: Abnormal   Collection Time   05/03/12 11:00 PM      Component Value Range   Color, Urine YELLOW  YELLOW   APPearance CLEAR  CLEAR   Specific Gravity, Urine >1.030 (*) 1.005 - 1.030   pH 5.5  5.0 - 8.0   Glucose, UA NEGATIVE  NEGATIVE mg/dL   Hgb urine dipstick NEGATIVE  NEGATIVE   Bilirubin Urine NEGATIVE  NEGATIVE   Ketones, ur NEGATIVE  NEGATIVE mg/dL   Protein, ur NEGATIVE  NEGATIVE mg/dL   Urobilinogen, UA 0.2  0.0 - 1.0 mg/dL   Nitrite NEGATIVE  NEGATIVE   Leukocytes, UA SMALL (*) NEGATIVE  URINE MICROSCOPIC-ADD ON  Status: Abnormal   Collection Time   05/03/12 11:00 PM      Component Value Range   Squamous Epithelial / LPF MANY (*) RARE   WBC, UA 7-10  <3 WBC/hpf   RBC / HPF 3-6  <3 RBC/hpf   Bacteria, UA MANY (*) RARE    MAU Course  Procedures Urine sent for culture.  Assessment and Plan  A:  SIUP at [redacted]w[redacted]d      Ligament pain      Nausea and vomiting   P:  Discharge      Rx Phenergan      Rx Macrobid      Encouraged to seek [facility]  [facility] 05/04/2012, 12:51 AM

## 2012-05-05 LAB — URINE CULTURE

## 2012-05-10 ENCOUNTER — Encounter (HOSPITAL_COMMUNITY): Payer: Self-pay | Admitting: *Deleted

## 2012-05-10 ENCOUNTER — Inpatient Hospital Stay (HOSPITAL_COMMUNITY)
Admission: AD | Admit: 2012-05-10 | Discharge: 2012-05-11 | Disposition: A | Discharge status: Home / Self Care | Payer: Medicaid Other | Source: Ambulatory Visit | Attending: Obstetrics and Gynecology | Admitting: Obstetrics and Gynecology

## 2012-05-10 DIAGNOSIS — O99891 Other specified diseases and conditions complicating pregnancy: Secondary | ICD-10-CM | POA: Insufficient documentation

## 2012-05-10 DIAGNOSIS — R51 Headache: Secondary | ICD-10-CM | POA: Insufficient documentation

## 2012-05-10 DIAGNOSIS — O26899 Other specified pregnancy related conditions, unspecified trimester: Secondary | ICD-10-CM

## 2012-05-10 LAB — URINALYSIS, ROUTINE W REFLEX MICROSCOPIC
Glucose, UA: NEGATIVE mg/dL
Ketones, ur: NEGATIVE mg/dL
Nitrite: NEGATIVE
Specific Gravity, Urine: >1.03 — ABNORMAL HIGH (ref 1.005–1.030)
pH: 6 (ref 5.0–8.0)

## 2012-05-10 LAB — URINE MICROSCOPIC-ADD ON

## 2012-05-10 MED ORDER — BUTALBITAL-APAP-CAFFEINE 50-325-40 MG PO TABS
2.0000 | ORAL_TABLET | Freq: Once | ORAL | Status: AC
  Administered 2012-05-10: 2 via ORAL
  Filled 2012-05-10: qty 2

## 2012-05-10 MED ORDER — PROMETHAZINE HCL 25 MG PO TABS
25.0000 mg | ORAL_TABLET | Freq: Once | ORAL | Status: AC
  Administered 2012-05-10: 25 mg via ORAL
  Filled 2012-05-10: qty 1

## 2012-05-10 NOTE — MAU Provider Note (Signed)
Robin Hodge [id number] @[redacted]w[redacted]d  by LMP Chief Complaint  Patient presents with  . Headache     First Provider Initiated Contact with Patient 05/10/12 2322      SUBJECTIVE  HPI: Pt is a 26 year-old G4P2012 at 12.1 weeks who reports headache for three days, worse today, no resolution w/ tylenol. Hx similar HA, prior to pregnancy. Describes HA as bilat frontal, throbbing, associated w/ nausea and photophobia. Denies fever, chills, neck stiffness, VB, abd pain.    Past Medical History  Diagnosis Date  . Heart murmur   . Abnormal Pap smear     colpo-ok since   Past Surgical History  Procedure Date  . Dilation and curettage of uterus    History   Social History  . Marital Status: Single    Spouse Name: N/A    Number of Children: N/A  . Years of Education: N/A   Occupational History  . Not on file.   Social History Main Topics  . Smoking status: Former Smoker -- 3 years    Types: Cigarettes    Quit date: 03/17/2012  . Smokeless tobacco: Never Used  . Alcohol Use: No  . Drug Use: No  . Sexually Active: Yes    Birth Control/ Protection: None   Other Topics Concern  . Not on file   Social History Narrative  . No narrative on file   No current facility-administered medications on file prior to encounter.   Current Outpatient Prescriptions on File Prior to Encounter  Medication Sig Dispense Refill  . promethazine (PHENERGAN) 25 MG tablet Take 1 tablet (25 mg total) by mouth every 6 (six) hours as needed for nausea.  30 tablet  0  . nitrofurantoin, macrocrystal-monohydrate, (MACROBID) 100 MG capsule Take 1 capsule (100 mg total) by mouth 2 (two) times daily.  14 capsule  0   No Known Allergies  ROS: Pertinent items in HPI  OBJECTIVE Blood pressure 143/80, pulse 94, temperature 98.1 F (36.7 C), temperature source Oral, resp. rate 20, height 5\' 7"  (1.702 m), weight 149.29 kg (329 lb 2 oz), last menstrual period 02/15/2012.  GENERAL: Well-developed, well-nourished  female in no acute distress.  HEENT: Normocephalic, good dentition. No sinus tenderness or neck pain.  HEART: normal rate RESP: normal effort ABDOMEN: Soft, nontender EXTREMITIES: Nontender, no edema NEURO: Alert and oriented SPECULUM EXAM: deferred FHR: 171 per doppler  LAB RESULTS  No results found for this or any previous visit (from the past 24 hour(s)). IMAGING  ED course Fioricet  And phenergan given. HA and nausea resolved  ASSESSMENT 1. Headache in pregnancy, antepartum    PLAN D/C home Comfort measures.follo Medication List  As of 05/14/2012 12:36 AM   TAKE these medications         acetaminophen 500 MG tablet   Commonly known as: TYLENOL   Take 1,000 mg by mouth every 6 (six) hours as needed.      butalbital-acetaminophen-caffeine 50-325-40 MG per tablet   Commonly known as: FIORICET, ESGIC   Take 1-2 tablets by mouth every 6 (six) hours as needed for headache.      nitrofurantoin (macrocrystal-monohydrate) 100 MG capsule   Commonly known as: MACROBID   Take 1 capsule (100 mg total) by mouth 2 (two) times daily.      prenatal multivitamin Tabs   Take 1 tablet by mouth daily.      promethazine 25 MG tablet   Commonly known as: PHENERGAN   Take 1 tablet (25 mg total) by  mouth every 6 (six) hours as needed for nausea.           Follow-up Information    Please follow up. (Keep your scheduled appt. Call for concerns)         Dorathy Kinsman 05/14/2012 12:36 AM

## 2012-05-10 NOTE — MAU Note (Signed)
Pt states, " I've had a headache for three days, and it is worse today. I've been taking tylenol but it isn't helping."

## 2012-05-11 DIAGNOSIS — R51 Headache: Secondary | ICD-10-CM

## 2012-05-11 DIAGNOSIS — O9989 Other specified diseases and conditions complicating pregnancy, childbirth and the puerperium: Secondary | ICD-10-CM

## 2012-05-11 MED ORDER — BUTALBITAL-APAP-CAFFEINE 50-325-40 MG PO TABS: 1.0000 | ORAL_TABLET | Freq: Four times a day (QID) | ORAL | Status: AC | PRN

## 2012-05-11 NOTE — Progress Notes (Signed)
Written and verbal d/c instructions given and understanding voiced. 

## 2012-05-14 NOTE — MAU Provider Note (Signed)
Agree with above note.  Robin Hodge 05/14/2012 8:47 AM

## 2012-05-23 LAB — OB RESULTS CONSOLE RUBELLA ANTIBODY, IGM: Rubella: IMMUNE

## 2012-05-23 LAB — OB RESULTS CONSOLE ANTIBODY SCREEN: Antibody Screen: NEGATIVE

## 2012-05-23 LAB — OB RESULTS CONSOLE ABO/RH: RH Type: NEGATIVE

## 2012-05-23 LAB — OB RESULTS CONSOLE HEPATITIS B SURFACE ANTIGEN: Hepatitis B Surface Ag: NEGATIVE

## 2012-08-02 ENCOUNTER — Encounter (HOSPITAL_COMMUNITY): Payer: Self-pay

## 2012-08-02 ENCOUNTER — Emergency Department (HOSPITAL_COMMUNITY)
Admission: EM | Admit: 2012-08-02 | Discharge: 2012-08-02 | Disposition: A | Discharge status: Home / Self Care | Payer: Medicaid Other | Attending: Emergency Medicine | Admitting: Emergency Medicine

## 2012-08-02 DIAGNOSIS — K011 Impacted teeth: Secondary | ICD-10-CM

## 2012-08-02 DIAGNOSIS — O9989 Other specified diseases and conditions complicating pregnancy, childbirth and the puerperium: Secondary | ICD-10-CM | POA: Insufficient documentation

## 2012-08-02 DIAGNOSIS — K047 Periapical abscess without sinus: Secondary | ICD-10-CM | POA: Insufficient documentation

## 2012-08-02 DIAGNOSIS — K006 Disturbances in tooth eruption: Secondary | ICD-10-CM | POA: Insufficient documentation

## 2012-08-02 DIAGNOSIS — Z87891 Personal history of nicotine dependence: Secondary | ICD-10-CM | POA: Insufficient documentation

## 2012-08-02 DIAGNOSIS — Z8679 Personal history of other diseases of the circulatory system: Secondary | ICD-10-CM | POA: Insufficient documentation

## 2012-08-02 MED ORDER — CLINDAMYCIN HCL 300 MG PO CAPS: 300.0000 mg | ORAL_CAPSULE | Freq: Three times a day (TID) | ORAL | Status: DC

## 2012-08-02 MED ORDER — CLINDAMYCIN HCL 300 MG PO CAPS
300.0000 mg | ORAL_CAPSULE | Freq: Once | ORAL | Status: AC
  Administered 2012-08-02: 300 mg via ORAL
  Filled 2012-08-02: qty 1

## 2012-08-02 NOTE — ED Provider Notes (Signed)
History     CSN: [id number]  Arrival date & time 08/02/12  0330   First MD Initiated Contact with Patient 08/02/12 0344      Chief Complaint  Patient presents with  . Dental Pain    (Consider location/radiation/quality/duration/timing/severity/associated sxs/prior treatment) HPI Comments: Patient has been taking antibiotics for the past 3, days for an impacted wisdom tooth and possible, dental abscess, prescribed by her dentist.  She has been prescribed, Percocet by her OB/GYN and she is [redacted] weeks pregnant.  Presents tonight to the emergency department, stating, that the pain is worse.  She is having difficulty swallowing.  Due to the pain  Patient is a 26 y.o. female presenting with tooth pain. The history is provided by the patient.  Dental PainThe primary symptoms include mouth pain. Primary symptoms do not include dental injury, oral bleeding, oral lesions, headaches, fever or sore throat. The symptoms began 3 to 5 days ago. The symptoms are unchanged. The symptoms occur constantly.  Additional symptoms include: gum swelling, trismus, trouble swallowing and pain with swallowing. Additional symptoms do not include: purulent gums, facial swelling and ear pain.    Past Medical History  Diagnosis Date  . Heart murmur   . Abnormal Pap smear     colpo-ok since    Past Surgical History  Procedure Date  . Dilation and curettage of uterus     Family History  Problem Relation Age of Onset  . Hypertension Mother   . Diabetes Mother   . Heart disease Mother   . Heart disease Father   . Other Neg Hx     History  Substance Use Topics  . Smoking status: Former Smoker -- 3 years    Types: Cigarettes    Quit date: 03/17/2012  . Smokeless tobacco: Never Used  . Alcohol Use: No    OB History    Grav Para Term Preterm Abortions TAB SAB Ect Mult Living   4 2   1  1   2       Review of Systems  Constitutional: Negative for fever and chills.  HENT: Positive for trouble  swallowing and dental problem. Negative for ear pain, sore throat, facial swelling, neck pain, neck stiffness and voice change.   Gastrointestinal: Negative for nausea.  Neurological: Negative for dizziness, weakness and headaches.    Allergies  Review of patient's allergies indicates no known allergies.  Home Medications   Current Outpatient Rx  Name Route Sig Dispense Refill  . ACETAMINOPHEN 500 MG PO TABS Oral Take 1,000 mg by mouth every 6 (six) hours as needed.    . OXYCODONE-ACETAMINOPHEN 10-325 MG PO TABS Oral Take 1 tablet by mouth every 6 (six) hours as needed. For pain    . PENICILLIN V POTASSIUM 500 MG PO TABS Oral Take 500 mg by mouth 4 (four) times daily.    Marland Kitchen PRENATAL MULTIVITAMIN CH Oral Take 1 tablet by mouth daily.      BP 123/75  Pulse 91  Temp 98.7 F (37.1 C) (Oral)  Resp 20  SpO2 99%  LMP 02/15/2012  Physical Exam  Constitutional: She is oriented to person, place, and time. She appears well-developed and well-nourished.       Morbidly obese  HENT:  Head: Normocephalic and atraumatic. There is trismus in the jaw.  Mouth/Throat: Uvula is midline and oropharynx is clear and moist.    Neck: Normal range of motion.  Cardiovascular: Normal rate.   Pulmonary/Chest: Effort normal.  Musculoskeletal: Normal range of  motion.  Neurological: She is alert and oriented to person, place, and time.  Skin: Skin is warm. No erythema.    ED Course  Procedures (including critical care time)  Labs Reviewed - No data to display No results found.   No diagnosis found.    MDM   Dr. Arnoldo Morale assisted with the physical exam due to patient's reluctance to open her mouth.  We've decided to change her antibiotic from penicillin to clindamycin.  Continue with Percocet and ice packs and encourage patient to followup with her dentist in the morning by phone        Arman Filter, NP 08/02/12 0442  Arman Filter, NP 08/02/12 [phone]

## 2012-08-02 NOTE — ED Notes (Addendum)
Pt states that she has been experiencing dental pain 10/10 for the last 2 days. She states that she went to her Dentist yesterday and was told that he did not have any emergency surgery appts. Pt states that it's a Left Lower Back tooth that is aching. Pt states that there is discharge coming from tooth that is causing her throat to hurt. Pt is 5 months pregnant. This is her 3rd pregnancy.

## 2012-08-02 NOTE — ED Provider Notes (Signed)
Medical screening examination/treatment/procedure(s) were performed by non-physician practitioner and as supervising physician I was immediately available for consultation/collaboration.    Julie Manly, MD 08/02/12 0608 

## 2012-08-07 ENCOUNTER — Inpatient Hospital Stay (HOSPITAL_COMMUNITY)
Admission: AD | Admit: 2012-08-07 | Discharge: 2012-08-07 | Disposition: A | Discharge status: Home / Self Care | Payer: Medicaid Other | Source: Ambulatory Visit | Attending: Obstetrics | Admitting: Obstetrics

## 2012-08-07 ENCOUNTER — Encounter (HOSPITAL_COMMUNITY): Payer: Self-pay | Admitting: *Deleted

## 2012-08-07 DIAGNOSIS — E860 Dehydration: Secondary | ICD-10-CM | POA: Insufficient documentation

## 2012-08-07 DIAGNOSIS — O99891 Other specified diseases and conditions complicating pregnancy: Secondary | ICD-10-CM | POA: Insufficient documentation

## 2012-08-07 DIAGNOSIS — O26899 Other specified pregnancy related conditions, unspecified trimester: Secondary | ICD-10-CM

## 2012-08-07 DIAGNOSIS — R109 Unspecified abdominal pain: Secondary | ICD-10-CM | POA: Insufficient documentation

## 2012-08-07 LAB — URINALYSIS, ROUTINE W REFLEX MICROSCOPIC
Bilirubin Urine: NEGATIVE
Glucose, UA: NEGATIVE mg/dL
Hgb urine dipstick: NEGATIVE
Ketones, ur: 15 mg/dL — AB
Leukocytes, UA: NEGATIVE
Nitrite: NEGATIVE
Protein, ur: NEGATIVE mg/dL
Specific Gravity, Urine: >1.03 — ABNORMAL HIGH (ref 1.005–1.030)
Urobilinogen, UA: 0.2 mg/dL (ref 0.0–1.0)
pH: 6 (ref 5.0–8.0)

## 2012-08-07 MED ORDER — OXYCODONE-ACETAMINOPHEN 5-325 MG PO TABS
1.0000 | ORAL_TABLET | Freq: Once | ORAL | Status: AC
  Administered 2012-08-07: 1 via ORAL
  Filled 2012-08-07: qty 1

## 2012-08-07 NOTE — MAU Note (Signed)
IS TAKING PEN FOR ABCESS --- TOOTH

## 2012-08-07 NOTE — MAU Note (Signed)
PT SAYS SHE STARTED HURTING  - TIGHTENING AT 9PM.     THEN  AT 0300- STARTED HURTING.  LAST SEX-  NONE WITHIN 24 HRS.    DENIES HSV AND  MRSA.

## 2012-08-07 NOTE — MAU Provider Note (Signed)
History     CSN: [id number]  Arrival date and time: 08/07/12 0405   None     No chief complaint on file.  HPI Robin Hodge is a 26 y.o. female @ [redacted]w[redacted]d gestation who presents to MAU with abdominal pain. The pain started approximately one hour prior to arrival. The onset was sudden. She couldn't go to sleep so was sitting relaxing when the pain started. She describes the pain as pressure. The pain is located in the mid lower abdomen. She rates the pain as 8/10. The pain comes and goes. Denies nausea, vomiting, vaginal bleeding or discharge.  The patient works 9 am - 2 pm and goes to school 6 pm - 10 pm. The history was provided by the patient.  OB History    Grav Para Term Preterm Abortions TAB SAB Ect Mult Living   4 2   1  1   2       Past Medical History  Diagnosis Date  . Heart murmur   . Abnormal Pap smear     colpo-ok since    Past Surgical History  Procedure Date  . Dilation and curettage of uterus     Family History  Problem Relation Age of Onset  . Hypertension Mother   . Diabetes Mother   . Heart disease Mother   . Heart disease Father   . Other Neg Hx     History  Substance Use Topics  . Smoking status: Former Smoker -- 3 years    Types: Cigarettes    Quit date: 03/17/2012  . Smokeless tobacco: Never Used  . Alcohol Use: No    Allergies: No Known Allergies  Prescriptions prior to admission  Medication Sig Dispense Refill  . acetaminophen (TYLENOL) 500 MG tablet Take 1,000 mg by mouth every 6 (six) hours as needed.      . clindamycin (CLEOCIN) 300 MG capsule Take 1 capsule (300 mg total) by mouth 3 (three) times daily.  30 capsule  0  . oxyCODONE-acetaminophen (PERCOCET) 10-325 MG per tablet Take 1 tablet by mouth every 6 (six) hours as needed. For pain      . penicillin v potassium (VEETID) 500 MG tablet Take 500 mg by mouth 4 (four) times daily.      . Prenatal Vit-Fe Fumarate-FA (PRENATAL MULTIVITAMIN) TABS Take 1 tablet by mouth daily.         Review of Systems  Constitutional: Negative for fever, chills and weight loss.  HENT: Negative for ear pain, nosebleeds, congestion, sore throat and neck pain.   Eyes: Negative for blurred vision, double vision, photophobia and pain.  Respiratory: Negative for cough, shortness of breath and wheezing.   Cardiovascular: Negative for chest pain, palpitations and leg swelling.  Gastrointestinal: Positive for abdominal pain. Negative for heartburn, nausea, vomiting, diarrhea and constipation.  Genitourinary: Negative for dysuria, urgency and frequency.  Musculoskeletal: Positive for back pain. Negative for myalgias.  Skin: Negative for itching and rash.  Neurological: Positive for headaches. Negative for dizziness, sensory change, speech change, seizures and weakness.  Endo/Heme/Allergies: Does not bruise/bleed easily.  Psychiatric/Behavioral: Negative for depression. The patient has insomnia. The patient is not nervous/anxious.    Physical Exam   Last menstrual period 02/15/2012.  Physical Exam  Nursing note and vitals reviewed. Constitutional: She is oriented to person, place, and time. She appears well-developed and well-nourished. No distress.  HENT:  Head: Normocephalic and atraumatic.  Eyes: EOM are normal.  Neck: Neck supple.  Cardiovascular: Normal rate.  Respiratory: Effort normal.  GI: Soft. There is tenderness in the suprapubic area. There is no CVA tenderness.  Musculoskeletal: Normal range of motion.  Neurological: She is alert and oriented to person, place, and time.  Skin: Skin is warm and dry.  Psychiatric: She has a normal mood and affect. Her behavior is normal. Judgment and thought content normal.   Results for orders placed during the hospital encounter of 08/07/12 (from the past 24 hour(s))  URINALYSIS, ROUTINE W REFLEX MICROSCOPIC     Status: Abnormal   Collection Time   08/07/12  4:08 AM      Component Value Range   Color, Urine YELLOW  YELLOW    APPearance CLEAR  CLEAR   Specific Gravity, Urine >1.030 (*) 1.005 - 1.030   pH 6.0  5.0 - 8.0   Glucose, UA NEGATIVE  NEGATIVE mg/dL   Hgb urine dipstick NEGATIVE  NEGATIVE   Bilirubin Urine NEGATIVE  NEGATIVE   Ketones, ur 15 (*) NEGATIVE mg/dL   Protein, ur NEGATIVE  NEGATIVE mg/dL   Urobilinogen, UA 0.2  0.0 - 1.0 mg/dL   Nitrite NEGATIVE  NEGATIVE   Leukocytes, UA NEGATIVE  NEGATIVE   Assessment: 26 y.o. female @ [redacted]w[redacted]d gestation with abdominal pain   Round ligament pain   Mild dehydration  Plan:  Percocet 5/325 mg. PO now   D/c home to follow up in the office  EFM: Baseline 150, accelerations 10x10, no decelerations. No contractions Discussed with the patient and all questioned fully answered. She will return if any problems arise.  MAU Course: discussed with Dr. Clearance Coots  Procedures  NEESE,HOPE, RN, FNP, [facility] 08/07/2012, 4:18 AM

## 2012-08-13 ENCOUNTER — Inpatient Hospital Stay (HOSPITAL_COMMUNITY)
Admission: AD | Admit: 2012-08-13 | Discharge: 2012-08-13 | Disposition: A | Discharge status: Home / Self Care | Payer: Medicaid Other | Source: Ambulatory Visit | Attending: Obstetrics | Admitting: Obstetrics

## 2012-08-13 ENCOUNTER — Encounter (HOSPITAL_COMMUNITY): Payer: Self-pay | Admitting: Obstetrics and Gynecology

## 2012-08-13 DIAGNOSIS — R109 Unspecified abdominal pain: Secondary | ICD-10-CM | POA: Insufficient documentation

## 2012-08-13 DIAGNOSIS — O2340 Unspecified infection of urinary tract in pregnancy, unspecified trimester: Secondary | ICD-10-CM

## 2012-08-13 DIAGNOSIS — O4690 Antepartum hemorrhage, unspecified, unspecified trimester: Secondary | ICD-10-CM | POA: Insufficient documentation

## 2012-08-13 DIAGNOSIS — N390 Urinary tract infection, site not specified: Secondary | ICD-10-CM | POA: Insufficient documentation

## 2012-08-13 DIAGNOSIS — O2390 Unspecified genitourinary tract infection in pregnancy, unspecified trimester: Secondary | ICD-10-CM

## 2012-08-13 LAB — URINALYSIS, ROUTINE W REFLEX MICROSCOPIC
Bilirubin Urine: NEGATIVE
Ketones, ur: NEGATIVE mg/dL
Nitrite: NEGATIVE
Specific Gravity, Urine: >1.03 — ABNORMAL HIGH (ref 1.005–1.030)
Urobilinogen, UA: 0.2 mg/dL (ref 0.0–1.0)
pH: 6 (ref 5.0–8.0)

## 2012-08-13 LAB — URINE MICROSCOPIC-ADD ON

## 2012-08-13 LAB — WET PREP, GENITAL: Clue Cells Wet Prep HPF POC: NONE SEEN

## 2012-08-13 MED ORDER — CEFUROXIME AXETIL 500 MG PO TABS: 500.0000 mg | ORAL_TABLET | Freq: Two times a day (BID) | ORAL | Status: DC

## 2012-08-13 NOTE — MAU Provider Note (Signed)
Chief Complaint:  Abdominal Cramping and Vaginal Bleeding   First Provider Initiated Contact with Patient 08/13/12 1123      HPI: Robin Hodge is a 26 y.o. [id number] at [redacted]w[redacted]d who presents to maternity admissions reporting spotting on toilet paper after wiping since this morning. She reports a three-day history of suprapubic cramping, urinary urgency, as well as frequency (Frequency has been ongoing throughout the pregnancy.) She did have antecedent intercourse this morning. Denies any previous episode of vaginal bleeding. Reports left mid and lower back pain intermittently. Denies irritative discharge, fever, malaise. Denies contractions, leakage of fluid. Good fetal movement.   Pregnancy Course: Uncomplicated. States anatomical ultrasound done in office was normal.  Past Medical History: Past Medical History  Diagnosis Date  . Heart murmur   . Abnormal Pap smear     colpo-ok since    Past obstetric history: OB History    Grav Para Term Preterm Abortions TAB SAB Ect Mult Living   5 2   2 1 1   2      # Outc Date GA Lbr Len/2nd Wgt Sex Del Anes PTL Lv   1 PAR 9/06    M SVD EPI No Yes   2 PAR 3/09    M SVD EPI No Yes   Comments: induced for pelvic pain   3 SAB            4 TAB            5 CUR               Past Surgical History: Past Surgical History  Procedure Date  . Dilation and curettage of uterus     Family History: Family History  Problem Relation Age of Onset  . Hypertension Mother   . Diabetes Mother   . Heart disease Mother   . Heart disease Father   . Other Neg Hx     Social History: History  Substance Use Topics  . Smoking status: Current Every Day Smoker -- 3 years    Types: Cigarettes    Last Attempt to Quit: 03/17/2012  . Smokeless tobacco: Never Used  . Alcohol Use: No    Allergies: No Known Allergies  Meds:  Prescriptions prior to admission  Medication Sig Dispense Refill  . acetaminophen (TYLENOL) 500 MG tablet Take 1,000 mg by mouth every  6 (six) hours as needed.      Marland Kitchen HYDROcodone-acetaminophen (LORTAB 7.5) 7.5-500 MG per tablet Take 1 tablet by mouth every 6 (six) hours as needed. Takes for pain      . Prenatal Vit-Fe Fumarate-FA (PRENATAL MULTIVITAMIN) TABS Take 1 tablet by mouth daily.        ROS: Pertinent findings in history of present illness.  Physical Exam  Blood pressure 118/67, pulse 86, temperature 98.4 F (36.9 C), temperature source Oral, resp. rate 18, last menstrual period 02/15/2012. GENERAL: Well-developed, well-nourished female in no acute distress.  HEENT: normocephalic HEART: normal rate RESP: normal effort ABDOMEN: Soft, minimally tender over SP, gravid appropriate for gestational age Back: No CVAT, "sore" to deep palpation at waist level on left EXTREMITIES: Nontender, no edema NEURO: alert and oriented SPECULUM EXAM: NEFG, watery discharge, no blood, cervix clean and appears long, closed    FHT:  Baseline 140, moderate variability, 10 bpm accelerations present, no decelerations; discontinuous tracing Contractions: none   Labs: Results for orders placed during the hospital encounter of 08/13/12 (from the past 24 hour(s))  WET PREP, GENITAL  Status: Abnormal   Collection Time   08/13/12 11:28 AM      Component Value Range   Yeast Wet Prep HPF POC NONE SEEN  NONE SEEN   Trich, Wet Prep NONE SEEN  NONE SEEN   Clue Cells Wet Prep HPF POC NONE SEEN  NONE SEEN   WBC, Wet Prep HPF POC FEW (*) NONE SEEN  URINALYSIS, ROUTINE W REFLEX MICROSCOPIC     Status: Abnormal   Collection Time   08/13/12 12:47 PM      Component Value Range   Color, Urine YELLOW  YELLOW   APPearance HAZY (*) CLEAR   Specific Gravity, Urine >1.030 (*) 1.005 - 1.030   pH 6.0  5.0 - 8.0   Glucose, UA NEGATIVE  NEGATIVE mg/dL   Hgb urine dipstick LARGE (*) NEGATIVE   Bilirubin Urine NEGATIVE  NEGATIVE   Ketones, ur NEGATIVE  NEGATIVE mg/dL   Protein, ur NEGATIVE  NEGATIVE mg/dL   Urobilinogen, UA 0.2  0.0 - 1.0 mg/dL    Nitrite NEGATIVE  NEGATIVE   Leukocytes, UA SMALL (*) NEGATIVE  URINE MICROSCOPIC-ADD ON     Status: Abnormal   Collection Time   08/13/12 12:47 PM      Component Value Range   Squamous Epithelial / LPF FEW (*) RARE   WBC, UA 21-50  <3 WBC/hpf   RBC / HPF 21-50  <3 RBC/hpf   Bacteria, UA FEW (*) RARE   Urine-Other MUCOUS PRESENT      Imaging:  No results found. MAU Course: Crist Fat neg  Assessment: 1. UTI (urinary tract infection) during pregnancy    [id number] at [redacted]w[redacted]d  Plan: C/W Dr. Clearance Coots re antibiotics. Advises pelvic rest Discharge home Labor precautions and fetal kick counts    Medication List     As of 08/13/2012  1:24 PM    TAKE these medications         acetaminophen 500 MG tablet   Commonly known as: TYLENOL   Take 1,000 mg by mouth every 6 (six) hours as needed.      cefUROXime 500 MG tablet   Commonly known as: CEFTIN   Take 1 tablet (500 mg total) by mouth 2 (two) times daily.      LORTAB 7.5 7.5-500 MG per tablet   Generic drug: HYDROcodone-acetaminophen   Take 1 tablet by mouth every 6 (six) hours as needed. Takes for pain      prenatal multivitamin Tabs   Take 1 tablet by mouth daily.          Danae Orleans, CNM 08/13/2012 11:46 AM

## 2012-08-13 NOTE — MAU Note (Signed)
Pt presents to MAU with chief complaint of abdominal cramping and spotting. Pt had intercourse this morning and noticed the scant amount of spotting today. Pt is [redacted]w[redacted]d

## 2012-08-16 LAB — URINE CULTURE: Colony Count: >=100000

## 2012-08-27 ENCOUNTER — Other Ambulatory Visit: Payer: Self-pay | Admitting: Obstetrics & Gynecology

## 2012-09-16 ENCOUNTER — Inpatient Hospital Stay (HOSPITAL_COMMUNITY)
Admission: AD | Admit: 2012-09-16 | Discharge: 2012-09-16 | Disposition: A | Discharge status: Home / Self Care | Payer: Medicaid Other | Source: Ambulatory Visit | Attending: Obstetrics | Admitting: Obstetrics

## 2012-09-16 DIAGNOSIS — Z298 Encounter for other specified prophylactic measures: Secondary | ICD-10-CM | POA: Insufficient documentation

## 2012-09-16 DIAGNOSIS — Z2989 Encounter for other specified prophylactic measures: Secondary | ICD-10-CM | POA: Insufficient documentation

## 2012-09-16 MED ORDER — RHO D IMMUNE GLOBULIN 1500 UNIT/2ML IJ SOLN
300.0000 ug | Freq: Once | INTRAMUSCULAR | Status: AC
  Administered 2012-09-16: 300 ug via INTRAMUSCULAR
  Filled 2012-09-16: qty 2

## 2012-09-17 LAB — RH IG WORKUP (INCLUDES ABO/RH)
Fetal Screen: NEGATIVE
Unit division: 0

## 2012-10-09 NOTE — L&D Delivery Note (Signed)
Delivery Note At 4:07 PM a viable female was delivered via Vaginal, Spontaneous Delivery (Presentation: ROA ).  APGAR: 8, 9.   Placenta status: Intact, Spontaneous.  Cord: 3 vessels with the following complications: None.    Anesthesia: Epidural  Episiotomy: None Lacerations: None Suture Repair: n/a Est. Blood Loss (mL): 100 ml  Mom to postpartum.  Baby to nursery-stable.  Hodge,Robin Narducci A 11/25/2012, 4:23 PM

## 2012-11-11 ENCOUNTER — Inpatient Hospital Stay (HOSPITAL_COMMUNITY): Payer: Medicaid Other

## 2012-11-11 ENCOUNTER — Inpatient Hospital Stay (HOSPITAL_COMMUNITY)
Admission: AD | Admit: 2012-11-11 | Discharge: 2012-11-12 | Disposition: A | Discharge status: Home / Self Care | Payer: Medicaid Other | Source: Ambulatory Visit | Attending: Obstetrics & Gynecology | Admitting: Obstetrics & Gynecology

## 2012-11-11 ENCOUNTER — Encounter (HOSPITAL_COMMUNITY): Payer: Self-pay | Admitting: *Deleted

## 2012-11-11 DIAGNOSIS — O479 False labor, unspecified: Secondary | ICD-10-CM | POA: Insufficient documentation

## 2012-11-11 NOTE — MAU Note (Signed)
Pt G5 P2 at 38.4wks having contractions every .  Denies bleeding or leaking.

## 2012-11-21 ENCOUNTER — Telehealth (HOSPITAL_COMMUNITY): Payer: Self-pay | Admitting: *Deleted

## 2012-11-21 ENCOUNTER — Encounter (HOSPITAL_COMMUNITY): Payer: Self-pay | Admitting: *Deleted

## 2012-11-21 NOTE — Telephone Encounter (Signed)
Preadmission screen  

## 2012-11-23 ENCOUNTER — Inpatient Hospital Stay (HOSPITAL_COMMUNITY)
Admission: AD | Admit: 2012-11-23 | Discharge: 2012-11-27 | DRG: 775 | Disposition: A | Discharge status: Home / Self Care | Payer: Medicaid Other | Source: Ambulatory Visit | Attending: Obstetrics | Admitting: Obstetrics

## 2012-11-23 ENCOUNTER — Encounter (HOSPITAL_COMMUNITY): Payer: Self-pay | Admitting: *Deleted

## 2012-11-23 DIAGNOSIS — R0602 Shortness of breath: Secondary | ICD-10-CM | POA: Diagnosis present

## 2012-11-23 DIAGNOSIS — O99892 Other specified diseases and conditions complicating childbirth: Principal | ICD-10-CM | POA: Diagnosis present

## 2012-11-23 DIAGNOSIS — R079 Chest pain, unspecified: Secondary | ICD-10-CM | POA: Diagnosis present

## 2012-11-23 DIAGNOSIS — R002 Palpitations: Secondary | ICD-10-CM | POA: Diagnosis present

## 2012-11-23 DIAGNOSIS — E669 Obesity, unspecified: Secondary | ICD-10-CM | POA: Diagnosis present

## 2012-11-23 DIAGNOSIS — O99214 Obesity complicating childbirth: Secondary | ICD-10-CM | POA: Diagnosis present

## 2012-11-23 HISTORY — DX: Anemia, unspecified: D64.9

## 2012-11-23 LAB — WET PREP, GENITAL
Clue Cells Wet Prep HPF POC: NONE SEEN
Trich, Wet Prep: NONE SEEN
Yeast Wet Prep HPF POC: NONE SEEN

## 2012-11-23 NOTE — MAU Note (Signed)
I've had shortness of breath for 3 days but worse today, esp when lying down. Feel like I'm leaking fld for couple hours. Pressure in my vagina

## 2012-11-23 NOTE — MAU Provider Note (Signed)
  History     CSN: [id number]  Arrival date and time: 11/23/12 2130   First Provider Initiated Contact with Patient 11/23/12 2233      Chief Complaint  Patient presents with  . Shortness of Breath  . Rupture of Membranes  . Pelvic Pain   HPI  Pt is a [id number] here at 40.2 wks IUP here with report shortness of breath that started couple of days ago. SOB occurs with all positions.  Reports midsternum chest pain that increases with movement.  Also reports questionable leaking of fluid "a couple of hours ago", pink tinged.  +pressure in vaginal area.    Past Medical History  Diagnosis Date  . Heart murmur   . Abnormal Pap smear     colpo-ok since  . Obese     Past Surgical History  Procedure Laterality Date  . Dilation and curettage of uterus      Family History  Problem Relation Age of Onset  . Hypertension Mother   . Diabetes Mother   . Heart disease Mother   . Heart disease Father   . Other Neg Hx     History  Substance Use Topics  . Smoking status: Current Every Day Smoker -- 3 years    Types: Cigarettes    Last Attempt to Quit: 03/17/2012  . Smokeless tobacco: Never Used  . Alcohol Use: No    Allergies: No Known Allergies  Prescriptions prior to admission  Medication Sig Dispense Refill  . acetaminophen (TYLENOL) 500 MG tablet Take 1,000 mg by mouth every 6 (six) hours as needed for pain.       Marland Kitchen esomeprazole (NEXIUM) 20 MG capsule Take 20 mg by mouth daily as needed (for heartburn).      . Prenatal Vit-Fe Fumarate-FA (PRENATAL MULTIVITAMIN) TABS Take 1 tablet by mouth daily.        Review of Systems  Respiratory: Positive for shortness of breath.   Cardiovascular: Positive for chest pain and palpitations.  Gastrointestinal: Positive for abdominal pain (cramping).  Genitourinary: Negative.        +clear fluid  All other systems reviewed and are negative.   Physical Exam   Blood pressure 133/70, pulse 102, temperature 97.8 F (36.6 C), resp. rate  20, height 5\' 7"  (1.702 m), weight 146.693 kg (323 lb 6.4 oz), last menstrual period 02/15/2012, SpO2 100.00%.  Physical Exam  Constitutional: She is oriented to person, place, and time. She appears well-developed and well-nourished. No distress.  HENT:  Head: Normocephalic.  Neck: Normal range of motion. Neck supple.  Cardiovascular: Normal rate, regular rhythm and normal heart sounds.   No murmur heard. Respiratory: Effort normal and breath sounds normal. No respiratory distress.  GI: Soft. There is no tenderness.  Genitourinary: No bleeding around the vagina. Vaginal discharge (mucusy) found.  Musculoskeletal: Normal range of motion. She exhibits edema (trace bilat).  Neurological: She is alert and oriented to person, place, and time.  Skin: Skin is warm and dry.    cervix 1/25/-3 FHR 150's, +accels, reactive Toco - irregular  MAU Course  Procedures  EKG - abnormal per E:link MD > recommends rule-out PE  Consult with Dr. Gaynell Face who assumes care of patient > spiral CT/chest Xray and labs ordered.  Assessment and Plan    [facility] 11/23/2012, 10:34 PM

## 2012-11-24 ENCOUNTER — Inpatient Hospital Stay (HOSPITAL_COMMUNITY): Payer: Medicaid Other

## 2012-11-24 ENCOUNTER — Encounter (HOSPITAL_COMMUNITY): Payer: Self-pay | Admitting: Obstetrics

## 2012-11-24 LAB — BASIC METABOLIC PANEL
Calcium: 8.7 mg/dL (ref 8.4–10.5)
GFR calc non Af Amer: >90 mL/min (ref >90)
Glucose, Bld: 98 mg/dL (ref 70–99)
Sodium: 136 mEq/L (ref 135–145)

## 2012-11-24 LAB — CBC
HCT: 32.3 % — ABNORMAL LOW (ref 36.0–46.0)
MCHC: 32.5 g/dL (ref 30.0–36.0)
Platelets: 214 10*3/uL (ref 150–400)
RDW: 18.1 % — ABNORMAL HIGH (ref 11.5–15.5)

## 2012-11-24 LAB — RPR: RPR Ser Ql: NONREACTIVE

## 2012-11-24 LAB — D-DIMER, QUANTITATIVE: D-Dimer, Quant: 0.67 ug/mL-FEU — ABNORMAL HIGH (ref 0.00–0.48)

## 2012-11-24 LAB — TYPE AND SCREEN
ABO/RH(D): O NEG
Antibody Screen: NEGATIVE

## 2012-11-24 LAB — PRO B NATRIURETIC PEPTIDE: Pro B Natriuretic peptide (BNP): <5 pg/mL (ref 0–125)

## 2012-11-24 MED ORDER — BUTORPHANOL TARTRATE 1 MG/ML IJ SOLN: 1.0000 mg | INTRAMUSCULAR | Status: DC | PRN

## 2012-11-24 MED ORDER — IOHEXOL 350 MG/ML SOLN
100.0000 mL | Freq: Once | INTRAVENOUS | Status: AC | PRN
  Administered 2012-11-24: 100 mL via INTRAVENOUS

## 2012-11-24 MED ORDER — OXYTOCIN 40 UNITS IN LACTATED RINGERS INFUSION - SIMPLE MED
1.0000 m[IU]/min | INTRAVENOUS | Status: DC
  Administered 2012-11-24: 1 m[IU]/min via INTRAVENOUS
  Filled 2012-11-24 (×2): qty 1000

## 2012-11-24 MED ORDER — LIDOCAINE HCL (PF) 1 % IJ SOLN
30.0000 mL | INTRAMUSCULAR | Status: DC | PRN
  Filled 2012-11-24: qty 30

## 2012-11-24 MED ORDER — ONDANSETRON HCL 4 MG/2ML IJ SOLN
4.0000 mg | Freq: Four times a day (QID) | INTRAMUSCULAR | Status: DC | PRN
  Administered 2012-11-25: 4 mg via INTRAVENOUS
  Filled 2012-11-24: qty 2

## 2012-11-24 MED ORDER — ASPIRIN 325 MG PO TABS
325.0000 mg | ORAL_TABLET | ORAL | Status: AC
  Administered 2012-11-24: 325 mg via ORAL
  Filled 2012-11-24: qty 1

## 2012-11-24 MED ORDER — IBUPROFEN 600 MG PO TABS
600.0000 mg | ORAL_TABLET | Freq: Four times a day (QID) | ORAL | Status: DC | PRN
  Filled 2012-11-24: qty 1

## 2012-11-24 MED ORDER — OXYCODONE-ACETAMINOPHEN 5-325 MG PO TABS
1.0000 | ORAL_TABLET | ORAL | Status: DC | PRN
  Administered 2012-11-24: 1 via ORAL
  Filled 2012-11-24: qty 1

## 2012-11-24 MED ORDER — LACTATED RINGERS IV SOLN
INTRAVENOUS | Status: DC
  Administered 2012-11-24 – 2012-11-25 (×4): via INTRAVENOUS

## 2012-11-24 MED ORDER — OXYTOCIN 40 UNITS IN LACTATED RINGERS INFUSION - SIMPLE MED
62.5000 mL/h | INTRAVENOUS | Status: DC
  Administered 2012-11-25: 62.5 mL/h via INTRAVENOUS

## 2012-11-24 MED ORDER — SODIUM CHLORIDE 0.9 % IV SOLN
INTRAVENOUS | Status: DC
  Administered 2012-11-24: 02:00:00 via INTRAVENOUS

## 2012-11-24 MED ORDER — LACTATED RINGERS IV SOLN
500.0000 mL | INTRAVENOUS | Status: DC | PRN
  Administered 2012-11-25: 500 mL via INTRAVENOUS

## 2012-11-24 MED ORDER — ZOLPIDEM TARTRATE 5 MG PO TABS
5.0000 mg | ORAL_TABLET | Freq: Every evening | ORAL | Status: DC | PRN
  Administered 2012-11-24: 5 mg via ORAL
  Filled 2012-11-24: qty 1

## 2012-11-24 MED ORDER — ACETAMINOPHEN 325 MG PO TABS: 650.0000 mg | ORAL_TABLET | ORAL | Status: DC | PRN

## 2012-11-24 MED ORDER — CITRIC ACID-SODIUM CITRATE 334-500 MG/5ML PO SOLN: 30.0000 mL | ORAL | Status: DC | PRN

## 2012-11-24 MED ORDER — OXYTOCIN BOLUS FROM INFUSION: 500.0000 mL | INTRAVENOUS | Status: DC

## 2012-11-24 NOTE — Progress Notes (Deleted)
Pt sitting up eating dinner--monitors left in place to assess further

## 2012-11-24 NOTE — Progress Notes (Signed)
Pt sitting up eating dinner--difficult to trace FHR--will leave monitors in place to assess further

## 2012-11-24 NOTE — H&P (Signed)
This is Dr. Francoise Ceo dictating the history and physical on  Robin Hodge she's a 27 year old gravida 5 para 202 to at 40 weeks and 3 days her EDC is 11/21/2012 negative GBS the patient came in with a history of shortness of breath for the past 3 days and on admission her oxygen saturation on room air was 100% she had a negative chest x-ray and and negative spiral CT cervix was 1 cm 50% vertex -37 decided that she would be admitted and induced she's now on  2 milliunits of Pitocinand is having occasional contractions Past medical history negative Past surgical history negative Social history negative System review negative Physical exam   massively obese female weighing 375 pounds HEENT negative Lungs clear to P&A Heart regular rhythm no murmurs no gallops Breasts pendulous Abdomen obese term Pelvic as described above Extremities negative

## 2012-11-24 NOTE — Progress Notes (Signed)
Pt sitting up in bed eating--difficult to trace FHR

## 2012-11-25 ENCOUNTER — Encounter (HOSPITAL_COMMUNITY): Payer: Self-pay | Admitting: *Deleted

## 2012-11-25 ENCOUNTER — Encounter (HOSPITAL_COMMUNITY): Payer: Self-pay | Admitting: Anesthesiology

## 2012-11-25 ENCOUNTER — Inpatient Hospital Stay (HOSPITAL_COMMUNITY): Payer: Medicaid Other | Admitting: Anesthesiology

## 2012-11-25 MED ORDER — MAGNESIUM HYDROXIDE 400 MG/5ML PO SUSP: 30.0000 mL | ORAL | Status: DC | PRN

## 2012-11-25 MED ORDER — TETANUS-DIPHTH-ACELL PERTUSSIS 5-2.5-18.5 LF-MCG/0.5 IM SUSP
0.5000 mL | Freq: Once | INTRAMUSCULAR | Status: AC
  Administered 2012-11-26: 0.5 mL via INTRAMUSCULAR

## 2012-11-25 MED ORDER — ZOLPIDEM TARTRATE 5 MG PO TABS: 5.0000 mg | ORAL_TABLET | Freq: Every evening | ORAL | Status: DC | PRN

## 2012-11-25 MED ORDER — FENTANYL CITRATE 0.05 MG/ML IJ SOLN
INTRAMUSCULAR | Status: AC
  Administered 2012-11-25: 100 ug via EPIDURAL
  Filled 2012-11-25: qty 2

## 2012-11-25 MED ORDER — LACTATED RINGERS IV SOLN
500.0000 mL | Freq: Once | INTRAVENOUS | Status: AC
  Administered 2012-11-25: 1000 mL via INTRAVENOUS

## 2012-11-25 MED ORDER — SENNOSIDES-DOCUSATE SODIUM 8.6-50 MG PO TABS
2.0000 | ORAL_TABLET | Freq: Every day | ORAL | Status: DC
  Administered 2012-11-25 – 2012-11-26 (×2): 2 via ORAL

## 2012-11-25 MED ORDER — FERROUS SULFATE 325 (65 FE) MG PO TABS
325.0000 mg | ORAL_TABLET | Freq: Two times a day (BID) | ORAL | Status: DC
  Administered 2012-11-27: 325 mg via ORAL
  Filled 2012-11-25: qty 1

## 2012-11-25 MED ORDER — DIPHENHYDRAMINE HCL 50 MG/ML IJ SOLN: 12.5000 mg | INTRAMUSCULAR | Status: DC | PRN

## 2012-11-25 MED ORDER — BUPIVACAINE HCL (PF) 0.25 % IJ SOLN
INTRAMUSCULAR | Status: DC | PRN
  Administered 2012-11-25 (×2): 5 mL via EPIDURAL
  Administered 2012-11-25: 2.5 mL via EPIDURAL

## 2012-11-25 MED ORDER — LIDOCAINE HCL (PF) 1 % IJ SOLN
INTRAMUSCULAR | Status: DC | PRN
  Administered 2012-11-25 (×4): 4 mL

## 2012-11-25 MED ORDER — PHENYLEPHRINE 40 MCG/ML (10ML) SYRINGE FOR IV PUSH (FOR BLOOD PRESSURE SUPPORT): 80.0000 ug | PREFILLED_SYRINGE | INTRAVENOUS | Status: DC | PRN

## 2012-11-25 MED ORDER — WITCH HAZEL-GLYCERIN EX PADS: 1.0000 "application " | MEDICATED_PAD | CUTANEOUS | Status: DC | PRN

## 2012-11-25 MED ORDER — OXYCODONE-ACETAMINOPHEN 5-325 MG PO TABS
1.0000 | ORAL_TABLET | ORAL | Status: DC | PRN
  Administered 2012-11-26 (×3): 1 via ORAL
  Administered 2012-11-26: 2 via ORAL
  Administered 2012-11-27: 1 via ORAL
  Filled 2012-11-25: qty 1
  Filled 2012-11-25: qty 2
  Filled 2012-11-25 (×3): qty 1

## 2012-11-25 MED ORDER — PRENATAL MULTIVITAMIN CH
1.0000 | ORAL_TABLET | Freq: Every day | ORAL | Status: DC
  Administered 2012-11-25 – 2012-11-27 (×3): 1 via ORAL
  Filled 2012-11-25 (×3): qty 1

## 2012-11-25 MED ORDER — EPHEDRINE 5 MG/ML INJ
10.0000 mg | INTRAVENOUS | Status: DC | PRN
  Filled 2012-11-25: qty 4

## 2012-11-25 MED ORDER — DIPHENHYDRAMINE HCL 25 MG PO CAPS: 25.0000 mg | ORAL_CAPSULE | Freq: Four times a day (QID) | ORAL | Status: DC | PRN

## 2012-11-25 MED ORDER — PHENYLEPHRINE 40 MCG/ML (10ML) SYRINGE FOR IV PUSH (FOR BLOOD PRESSURE SUPPORT)
80.0000 ug | PREFILLED_SYRINGE | INTRAVENOUS | Status: DC | PRN
  Filled 2012-11-25: qty 5

## 2012-11-25 MED ORDER — DIBUCAINE 1 % RE OINT: 1.0000 "application " | TOPICAL_OINTMENT | RECTAL | Status: DC | PRN

## 2012-11-25 MED ORDER — ONDANSETRON HCL 4 MG/2ML IJ SOLN: 4.0000 mg | INTRAMUSCULAR | Status: DC | PRN

## 2012-11-25 MED ORDER — EPHEDRINE 5 MG/ML INJ
10.0000 mg | INTRAVENOUS | Status: DC | PRN
  Administered 2012-11-25: 10 mg via INTRAVENOUS

## 2012-11-25 MED ORDER — ONDANSETRON HCL 4 MG PO TABS: 4.0000 mg | ORAL_TABLET | ORAL | Status: DC | PRN

## 2012-11-25 MED ORDER — LANOLIN HYDROUS EX OINT: TOPICAL_OINTMENT | CUTANEOUS | Status: DC | PRN

## 2012-11-25 MED ORDER — BUPIVACAINE HCL (PF) 0.5 % IJ SOLN
INTRAMUSCULAR | Status: DC | PRN
  Administered 2012-11-25: 2.5 mL via EPIDURAL

## 2012-11-25 MED ORDER — FENTANYL 2.5 MCG/ML BUPIVACAINE 1/10 % EPIDURAL INFUSION (WH - ANES)
14.0000 mL/h | INTRAMUSCULAR | Status: DC
  Administered 2012-11-25: 14 mL/h via EPIDURAL
  Filled 2012-11-25: qty 125

## 2012-11-25 MED ORDER — MEASLES, MUMPS & RUBELLA VAC ~~LOC~~ INJ: 0.5000 mL | INJECTION | Freq: Once | SUBCUTANEOUS | Status: DC

## 2012-11-25 MED ORDER — IBUPROFEN 600 MG PO TABS
600.0000 mg | ORAL_TABLET | Freq: Four times a day (QID) | ORAL | Status: DC
  Administered 2012-11-25 – 2012-11-27 (×7): 600 mg via ORAL
  Filled 2012-11-25 (×6): qty 1

## 2012-11-25 MED ORDER — BENZOCAINE-MENTHOL 20-0.5 % EX AERO
1.0000 "application " | INHALATION_SPRAY | CUTANEOUS | Status: DC | PRN
  Administered 2012-11-25: 1 via TOPICAL
  Filled 2012-11-25: qty 56

## 2012-11-25 NOTE — Anesthesia Procedure Notes (Signed)
Epidural Patient location during procedure: OB Start time: 11/25/2012 11:58 AM  Staffing Performed by: anesthesiologist   Preanesthetic Checklist Completed: patient identified, site marked, surgical consent, pre-op evaluation, timeout performed, IV checked, risks and benefits discussed and monitors and equipment checked  Epidural Patient position: sitting Prep: site prepped and draped and DuraPrep Patient monitoring: continuous pulse ox and blood pressure Approach: midline Injection technique: LOR air  Needle:  Needle type: Tuohy  Needle gauge: 17 G Needle length: 9 cm and 9 Needle insertion depth: 7.5 cm Catheter type: closed end flexible Catheter size: 19 Gauge Catheter at skin depth: 13 cm Test dose: negative  Assessment Events: blood not aspirated, injection not painful, no injection resistance, negative IV test and no paresthesia  Additional Notes Discussed risk of headache, infection, bleeding, nerve injury and failed or incomplete block.  Patient voices understanding and wishes to proceed.  Epidural placed easily on first attempt.  No paresthesia.  Patient tolerated procedure well with no apparent complications.  Jasmine December, MDReason for block:procedure for pain

## 2012-11-25 NOTE — Anesthesia Preprocedure Evaluation (Signed)
Anesthesia Evaluation  Patient identified by MRN, date of birth, ID band Patient awake    Reviewed: Allergy & Precautions, H&P , NPO status , Patient's Chart, lab work & pertinent test results, reviewed documented beta blocker date and time   History of Anesthesia Complications Negative for: history of anesthetic complications  Airway Mallampati: II TM Distance: >3 FB Neck ROM: full    Dental  (+) Teeth Intact   Pulmonary Current Smoker,  breath sounds clear to auscultation        Cardiovascular + Valvular Problems/Murmurs Rhythm:regular Rate:Normal + Systolic murmurs    Neuro/Psych negative neurological ROS  negative psych ROS   GI/Hepatic Neg liver ROS, GERD-  Medicated and Controlled,  Endo/Other  Morbid obesity  Renal/GU negative Renal ROS     Musculoskeletal   Abdominal   Peds  Hematology  (+) anemia ,   Anesthesia Other Findings   Reproductive/Obstetrics (+) Pregnancy                           Anesthesia Physical Anesthesia Plan  ASA: III  Anesthesia Plan: Epidural   Post-op Pain Management:    Induction:   Airway Management Planned:   Additional Equipment:   Intra-op Plan:   Post-operative Plan:   Informed Consent: I have reviewed the patients History and Physical, chart, labs and discussed the procedure including the risks, benefits and alternatives for the proposed anesthesia with the patient or authorized representative who has indicated his/her understanding and acceptance.     Plan Discussed with:   Anesthesia Plan Comments:         Anesthesia Quick Evaluation

## 2012-11-25 NOTE — Progress Notes (Signed)
Monitor off for sleep.

## 2012-11-25 NOTE — Progress Notes (Signed)
Patient ID: Robin Hodge, female   DOB: 16-Jul-1986, 27 y.o.   MRN: [id number] Induction was begun yesterday morning with Pitocin and she went to a maximum 6 milliunits but was not feeling her contractions so she was allowed to rest overnight and Pitocin restarted at 4 AM today

## 2012-11-26 ENCOUNTER — Encounter (HOSPITAL_COMMUNITY): Payer: Self-pay | Admitting: Anesthesiology

## 2012-11-26 MED ORDER — PNEUMOCOCCAL VAC POLYVALENT 25 MCG/0.5ML IJ INJ
0.5000 mL | INJECTION | INTRAMUSCULAR | Status: AC
  Administered 2012-11-27: 0.5 mL via INTRAMUSCULAR
  Filled 2012-11-26: qty 0.5

## 2012-11-26 MED ORDER — FENTANYL CITRATE 0.05 MG/ML IJ SOLN: 100.0000 ug | Freq: Once | INTRAMUSCULAR | Status: DC

## 2012-11-26 NOTE — Progress Notes (Signed)
UR chart review completed.  

## 2012-11-26 NOTE — Progress Notes (Signed)
Post Partum Day 1 Subjective: no complaints  Objective: Blood pressure 134/74, pulse 70, temperature 97.5 F (36.4 C), temperature source Oral, resp. rate 18, height 5\' 7"  (1.702 m), weight 323 lb (146.512 kg), last menstrual period 02/15/2012, SpO2 99.00%, unknown if currently breastfeeding.  Physical Exam:  General: alert and no distress Lochia: appropriate Uterine Fundus: firm Incision: none DVT Evaluation: No evidence of DVT seen on physical exam.   Recent Labs  11/24/12 0115  HGB 10.5*  HCT 32.3*    Assessment/Plan: Plan for discharge tomorrow   LOS: 3 days   Winifred Bodiford A 11/26/2012, 9:13 AM

## 2012-11-26 NOTE — Anesthesia Postprocedure Evaluation (Deleted)
  Anesthesia Post-op Note  Patient: Robin Hodge  Procedure(s) Performed: * No procedures listed *  Patient Location: Mother/Baby  Anesthesia Type:Epidural  Level of Consciousness: awake, alert  and oriented  Airway and Oxygen Therapy: Patient Spontanous Breathing  Post-op Pain: mild  Post-op Assessment: Post-op Vital signs reviewed, Patient's Cardiovascular Status Stable, No headache, No backache, No residual numbness and No residual motor weakness  Post-op Vital Signs: Reviewed and stable  Complications: No apparent anesthesia complications

## 2012-11-27 MED ORDER — IBUPROFEN 600 MG PO TABS: 600.0000 mg | ORAL_TABLET | Freq: Four times a day (QID) | ORAL | Status: DC | PRN

## 2012-11-27 MED ORDER — RHO D IMMUNE GLOBULIN 1500 UNIT/2ML IJ SOLN
300.0000 ug | Freq: Once | INTRAMUSCULAR | Status: AC
  Administered 2012-11-27: 300 ug via INTRAMUSCULAR
  Filled 2012-11-27: qty 2

## 2012-11-27 MED ORDER — OXYCODONE-ACETAMINOPHEN 5-325 MG PO TABS: 1.0000 | ORAL_TABLET | ORAL | Status: DC | PRN

## 2012-11-27 NOTE — Anesthesia Preprocedure Evaluation (Deleted)
Anesthesia Evaluation  Patient identified by MRN, date of birth, ID band Patient awake    Reviewed: Allergy & Precautions, H&P , NPO status , Patient's Chart, lab work & pertinent test results, reviewed documented beta blocker date and time   History of Anesthesia Complications Negative for: history of anesthetic complications  Airway Mallampati: II TM Distance: >3 FB Neck ROM: full    Dental  (+) Teeth Intact   Pulmonary Current Smoker,  breath sounds clear to auscultation        Cardiovascular + Valvular Problems/Murmurs Rhythm:regular Rate:Normal + Systolic murmurs    Neuro/Psych negative neurological ROS  negative psych ROS   GI/Hepatic Neg liver ROS, GERD-  Medicated and Controlled,  Endo/Other  Morbid obesity  Renal/GU negative Renal ROS     Musculoskeletal   Abdominal   Peds  Hematology   Anesthesia Other Findings   Reproductive/Obstetrics (+) Pregnancy                           Anesthesia Physical  Anesthesia Plan  ASA: III  Anesthesia Plan: Epidural   Post-op Pain Management:    Induction:   Airway Management Planned:   Additional Equipment:   Intra-op Plan:   Post-operative Plan:   Informed Consent: I have reviewed the patients History and Physical, chart, labs and discussed the procedure including the risks, benefits and alternatives for the proposed anesthesia with the patient or authorized representative who has indicated his/her understanding and acceptance.     Plan Discussed with:   Anesthesia Plan Comments:         Anesthesia Quick Evaluation

## 2012-11-27 NOTE — Progress Notes (Signed)
Post Partum Day 1 Subjective: no complaints  Objective: Blood pressure 145/96, pulse 80, temperature 97.9 F (36.6 C), temperature source Oral, resp. rate 18, height 5\' 7"  (1.702 m), weight 323 lb (146.512 kg), last menstrual period 02/15/2012, SpO2 99.00%, unknown if currently breastfeeding.  Physical Exam:  General: alert and no distress Lochia: appropriate Uterine Fundus: firm Incision: none DVT Evaluation: No evidence of DVT seen on physical exam.  No results found for this basename: HGB, HCT,  in the last 72 hours  Assessment/Plan: Discharge home   LOS: 4 days   HARPER,CHARLES A 11/27/2012, 4:55 AM

## 2012-11-27 NOTE — Discharge Summary (Signed)
Obstetric Discharge Summary Reason for Admission: onset of labor Prenatal Procedures: ultrasound Intrapartum Procedures: spontaneous vaginal delivery Postpartum Procedures: none Complications-Operative and Postpartum: none Hemoglobin  Date Value Range Status  11/24/2012 10.5* 12.0 - 15.0 g/dL Final     HCT  Date Value Range Status  11/24/2012 32.3* 36.0 - 46.0 % Final    Physical Exam:  General: alert and no distress Lochia: appropriate Uterine Fundus: firm Incision: none DVT Evaluation: No evidence of DVT seen on physical exam.  Discharge Diagnoses: Term Pregnancy-delivered  Discharge Information: Date: 11/27/2012 Activity: pelvic rest Diet: routine Medications: PNV, Ibuprofen, Colace and Percocet Condition: stable Instructions: refer to practice specific booklet Discharge to: home Follow-up Information   Follow up with Robin Hodge A, MD. Schedule an appointment as soon as possible for Hodge visit in 2 weeks.   Contact information:   [address] [place] [address] [phone]       Newborn Data: Live born female  Birth Weight: 8 lb 5.5 oz (3785 g) APGAR: 8, 9  Home with mother.  Robin Hodge 11/27/2012, 4:59 AM

## 2012-11-28 LAB — RH IG WORKUP (INCLUDES ABO/RH)
ABO/RH(D): O NEG
Fetal Screen: NEGATIVE
Gestational Age(Wks): 40.4

## 2012-11-28 NOTE — Anesthesia Postprocedure Evaluation (Addendum)
Anesthesia Post-op Note  Patient: Robin Hodge  Procedure(s) Performed: * No procedures listed *  Patient Location: Mother/Baby  Anesthesia Type:Epidural  Level of Consciousness: awake, alert and oriented  Airway and Oxygen Therapy: Patient Spontanous Breathing  Post-op Pain: mild  Post-op Assessment: Post-op Vital signs reviewed, Patient's Cardiovascular Status Stable, No headache, No backache, No residual numbness and No residual motor weakness  Post-op Vital Signs: Reviewed and stable  Complications: No apparent anesthesia complications

## 2013-01-13 ENCOUNTER — Encounter: Payer: Medicaid Other | Admitting: Obstetrics

## 2013-01-13 ENCOUNTER — Ambulatory Visit: Payer: Self-pay | Admitting: Obstetrics

## 2013-01-13 ENCOUNTER — Encounter: Payer: Self-pay | Admitting: Obstetrics

## 2013-01-13 ENCOUNTER — Ambulatory Visit (INDEPENDENT_AMBULATORY_CARE_PROVIDER_SITE_OTHER): Payer: Medicaid Other | Admitting: Obstetrics

## 2013-01-13 DIAGNOSIS — Z3009 Encounter for other general counseling and advice on contraception: Secondary | ICD-10-CM

## 2013-01-13 DIAGNOSIS — I10 Essential (primary) hypertension: Secondary | ICD-10-CM

## 2013-01-13 DIAGNOSIS — J302 Other seasonal allergic rhinitis: Secondary | ICD-10-CM | POA: Insufficient documentation

## 2013-01-13 DIAGNOSIS — J309 Allergic rhinitis, unspecified: Secondary | ICD-10-CM

## 2013-01-13 MED ORDER — TRIAMTERENE-HCTZ 37.5-25 MG PO CAPS: 1.0000 | ORAL_CAPSULE | ORAL | Status: DC

## 2013-01-13 MED ORDER — LORATADINE 10 MG PO TABS: 10.0000 mg | ORAL_TABLET | Freq: Every day | ORAL | Status: DC

## 2013-01-13 NOTE — Progress Notes (Signed)
Subjective:     Robin Hodge is a 27 y.o. female who presents for a postpartum visit. She is 7 weeks postpartum following a spontaneous vaginal delivery. I have fully reviewed the prenatal and intrapartum course. The delivery was at 40.3 gestational weeks. Outcome: spontaneous vaginal delivery. Anesthesia: epidural. Postpartum course has been normal. Baby's course has been normal. Baby is feeding by bottle - Lucien Mons Start Gentle. Bleeding no bleeding. Bowel function is normal. Bladder function is normal. Patient is sexually active. Contraception method is none. Postpartum depression screening: negative.  The following portions of the patient's history were reviewed and updated as appropriate: allergies, current medications, past family history, past medical history, past social history, past surgical history and problem list.  Review of Systems A comprehensive review of systems was negative.   Objective:    BP 147/92  Pulse 89  Temp(Src) 99.1 F (37.3 C)  Ht 5\' 7"  (1.702 m)  Wt 315 lb (142.883 kg)  BMI 49.32 kg/m2  LMP 12/27/2012  Breastfeeding? No  General:  alert and no distress   Breasts:  inspection negative, no nipple discharge or bleeding, no masses or nodularity palpable  Lungs: Not examined  Heart:  Not examined  Abdomen: normal findings: soft, non-tender   Vulva:  normal  Vagina: normal vagina  Cervix:  no lesions  Corpus: normal size, contour, position, consistency, mobility, non-tender  Adnexa:  no mass, fullness, tenderness  Rectal Exam: Not performed.        Assessment:     Normal postpartum exam. Pap smear not done at today's visit.   Want    1. Contraception: none 2. Wants tubal sterilization. 3. Follow up in: 2 weeks for tubal consult or as needed.

## 2013-01-20 ENCOUNTER — Ambulatory Visit: Payer: Self-pay | Admitting: Obstetrics

## 2013-01-29 ENCOUNTER — Institutional Professional Consult (permissible substitution): Payer: Medicaid Other | Admitting: Obstetrics & Gynecology

## 2013-01-30 ENCOUNTER — Telehealth: Payer: Self-pay | Admitting: *Deleted

## 2013-01-30 MED ORDER — NORETHIN ACE-ETH ESTRAD-FE 1-20 MG-MCG(24) PO CHEW: 1.0000 | CHEWABLE_TABLET | Freq: Every day | ORAL | Status: DC

## 2013-01-30 NOTE — Telephone Encounter (Addendum)
Patient called regarding a script for birth control.  She was unable to make her appointment for a tubal consult and now rescheduled for July.  Patient is requesting birth control pills for a couple of months until her surgery.  LM ON VM TO CB.  3:49pm - sent script for Minastrin Fe to [facility] pharmacy per Dr. Clearance Coots with 6 refills.

## 2013-05-05 ENCOUNTER — Institutional Professional Consult (permissible substitution): Payer: Medicaid Other | Admitting: Obstetrics & Gynecology

## 2013-08-14 ENCOUNTER — Other Ambulatory Visit: Payer: Self-pay

## 2013-10-09 NOTE — L&D Delivery Note (Addendum)
Final Labor Progress Note At 1005 pt arrive to MAU via EMS c/o ctx since last night.  VE C/C/0 w/BBW  Pt transferred to  L&D immediately to prepare for SVD  Vaginal Delivery Note Spontaneous rupture of membranes with thick meconium today, at 1115, clear.  GBS was positive, AMP 2g give for advanced labor.  NICU team call to the room for delivery.  NICHD Category 1.    Spontaneous pushing begin at  1118.   After 2 minutes of pushing the head, shoulders and the body of a viable female infant "Kion" delivered spontaneously with maternal effort in the LOA position at 1120   Loose [place] x 1 reducable but to pushed right thru therefore infant somersaulted thru without difficulty.   With vigorous tone and spontaneous cry, the infant was placed on moms abd.  The cord was clamped, cut and the infant was handed to the NICU team/nursing staff for immediate assessment bc of the meconium    Spontaneous delivery of a intact placenta with a 3 vessel cord via Sh[place] at 1128.   Episiotomy: None   The vulva, perineum, vaginal vault, rectum and cervix were inspected, no repairs needed.   Postpartum pitocin as ordered.  Fundus firm, lochia minimum, bleeding under control. EBL 200, Pt hemodynamically stable.   Sponge, laps and needle count correct and verified with the primary care nurse.  Attending MD available at all times.    Mom and baby were left in stable condition, baby in the warmer. Routine postpartum orders   Mother desires PP BTL for contraception   Infant to have out patient circumcision Bottle feeding  Placenta to pathology: NO    Cord Gases sent to lab: NO Cord blood sent to lab: YES   APGARS:  8 at 1 minute and 9 at 5 minutes Weight:. 8lb 10oz     Henriette Hesser, CNM, MSN 08/11/2014. 11:55 AM

## 2013-12-19 ENCOUNTER — Inpatient Hospital Stay (HOSPITAL_COMMUNITY)
Admission: AD | Admit: 2013-12-19 | Discharge: 2013-12-19 | Disposition: A | Discharge status: Home / Self Care | Payer: Medicaid Other | Source: Ambulatory Visit | Attending: Family Medicine | Admitting: Family Medicine

## 2013-12-19 ENCOUNTER — Inpatient Hospital Stay (HOSPITAL_COMMUNITY): Payer: Medicaid Other

## 2013-12-19 ENCOUNTER — Encounter (HOSPITAL_COMMUNITY): Payer: Self-pay

## 2013-12-19 DIAGNOSIS — O9989 Other specified diseases and conditions complicating pregnancy, childbirth and the puerperium: Secondary | ICD-10-CM

## 2013-12-19 DIAGNOSIS — R102 Pelvic and perineal pain: Secondary | ICD-10-CM

## 2013-12-19 DIAGNOSIS — O219 Vomiting of pregnancy, unspecified: Secondary | ICD-10-CM

## 2013-12-19 DIAGNOSIS — N949 Unspecified condition associated with female genital organs and menstrual cycle: Secondary | ICD-10-CM | POA: Insufficient documentation

## 2013-12-19 DIAGNOSIS — O26891 Other specified pregnancy related conditions, first trimester: Secondary | ICD-10-CM

## 2013-12-19 DIAGNOSIS — O210 Mild hyperemesis gravidarum: Secondary | ICD-10-CM | POA: Insufficient documentation

## 2013-12-19 DIAGNOSIS — O99891 Other specified diseases and conditions complicating pregnancy: Secondary | ICD-10-CM | POA: Insufficient documentation

## 2013-12-19 DIAGNOSIS — O99330 Smoking (tobacco) complicating pregnancy, unspecified trimester: Secondary | ICD-10-CM | POA: Insufficient documentation

## 2013-12-19 DIAGNOSIS — R109 Unspecified abdominal pain: Secondary | ICD-10-CM | POA: Insufficient documentation

## 2013-12-19 LAB — URINALYSIS, ROUTINE W REFLEX MICROSCOPIC
Bilirubin Urine: NEGATIVE
GLUCOSE, UA: NEGATIVE mg/dL
HGB URINE DIPSTICK: NEGATIVE
Ketones, ur: NEGATIVE mg/dL
Nitrite: NEGATIVE
PH: 6 (ref 5.0–8.0)
Protein, ur: NEGATIVE mg/dL
SPECIFIC GRAVITY, URINE: 1.025 (ref 1.005–1.030)
UROBILINOGEN UA: 0.2 mg/dL (ref 0.0–1.0)

## 2013-12-19 LAB — CBC
HEMATOCRIT: 32.9 % — AB (ref 36.0–46.0)
Hemoglobin: 10.4 g/dL — ABNORMAL LOW (ref 12.0–15.0)
MCH: 23.6 pg — ABNORMAL LOW (ref 26.0–34.0)
MCHC: 31.6 g/dL (ref 30.0–36.0)
MCV: 74.6 fL — ABNORMAL LOW (ref 78.0–100.0)
Platelets: 342 10*3/uL (ref 150–400)
RBC: 4.41 MIL/uL (ref 3.87–5.11)
RDW: 20.6 % — AB (ref 11.5–15.5)
WBC: 6.9 10*3/uL (ref 4.0–10.5)

## 2013-12-19 LAB — URINE MICROSCOPIC-ADD ON

## 2013-12-19 LAB — POCT PREGNANCY, URINE: PREG TEST UR: POSITIVE — AB

## 2013-12-19 LAB — HCG, QUANTITATIVE, PREGNANCY: hCG, Beta Chain, Quant, S: 9840 m[IU]/mL — ABNORMAL HIGH (ref <5)

## 2013-12-19 MED ORDER — PROMETHAZINE HCL 12.5 MG PO TABS: 12.5000 mg | ORAL_TABLET | Freq: Once | ORAL | Status: DC

## 2013-12-19 MED ORDER — PROMETHAZINE HCL 25 MG PO TABS
12.5000 mg | ORAL_TABLET | Freq: Once | ORAL | Status: AC
  Administered 2013-12-19: 12.5 mg via ORAL
  Filled 2013-12-19: qty 1

## 2013-12-19 NOTE — MAU Note (Signed)
Been having a lot of cramping and throwing up. Started today.

## 2013-12-19 NOTE — MAU Provider Note (Signed)
History     CSN: [id number]  Arrival date and time: 12/19/13 1659   First Provider Initiated Contact with Patient 12/19/13 1809      Chief Complaint  Patient presents with  . Abdominal Cramping  . Vomiting   Abdominal Cramping    Robin Hodge is a 28 y.o. [phone] at [redacted]w[redacted]d who presents today with cramping in her abdomen and back. She states that the cramping started last night, and has gotten worse throughout the day. She rates her pain 6/10. She denies any VB, and states that she took a pregnancy test at home last night that was positive. She has also had nausea. She states that she took phenergan in her last pregnancy, and it helped.   Past Medical History  Diagnosis Date  . Heart murmur   . Abnormal Pap smear     colpo-ok since  . Obese   . Anemia     Past Surgical History  Procedure Laterality Date  . Dilation and curettage of uterus      Family History  Problem Relation Age of Onset  . Hypertension Mother   . Diabetes Mother   . Heart disease Mother   . Heart disease Father   . Other Neg Hx     History  Substance Use Topics  . Smoking status: Current Every Day Smoker -- 0.25 packs/day for 3 years    Types: Cigarettes  . Smokeless tobacco: Never Used  . Alcohol Use: No    Allergies: No Known Allergies  No prescriptions prior to admission    ROS Physical Exam   Blood pressure 129/78, pulse 98, temperature 98.5 F (36.9 C), temperature source Oral, resp. rate 18, height 5\' 5"  (1.651 m), weight 136.079 kg (300 lb), last menstrual period 10/21/2013, not currently breastfeeding.  Physical Exam  Nursing note and vitals reviewed. Constitutional: She is oriented to person, place, and time. She appears well-developed and well-nourished. No distress.  Cardiovascular: Normal rate.   Respiratory: Effort normal.  GI: Soft. There is no tenderness. There is no rebound.  Neurological: She is alert and oriented to person, place, and time.  Skin: Skin is warm  and dry.  Psychiatric: She has a normal mood and affect.    MAU Course  Procedures  Results for orders placed during the hospital encounter of 12/19/13 (from the past 24 hour(s))  URINALYSIS, ROUTINE W REFLEX MICROSCOPIC     Status: Abnormal   Collection Time    12/19/13  5:35 PM      Result Value Ref Range   Color, Urine YELLOW  YELLOW   APPearance CLEAR  CLEAR   Specific Gravity, Urine 1.025  1.005 - 1.030   pH 6.0  5.0 - 8.0   Glucose, UA NEGATIVE  NEGATIVE mg/dL   Hgb urine dipstick NEGATIVE  NEGATIVE   Bilirubin Urine NEGATIVE  NEGATIVE   Ketones, ur NEGATIVE  NEGATIVE mg/dL   Protein, ur NEGATIVE  NEGATIVE mg/dL   Urobilinogen, UA 0.2  0.0 - 1.0 mg/dL   Nitrite NEGATIVE  NEGATIVE   Leukocytes, UA TRACE (*) NEGATIVE  URINE MICROSCOPIC-ADD ON     Status: Abnormal   Collection Time    12/19/13  5:35 PM      Result Value Ref Range   Squamous Epithelial / LPF MANY (*) RARE   WBC, UA 0-2  <3 WBC/hpf  POCT PREGNANCY, URINE     Status: Abnormal   Collection Time    12/19/13  5:37 PM  Result Value Ref Range   Preg Test, Ur POSITIVE (*) NEGATIVE  CBC     Status: Abnormal   Collection Time    12/19/13  6:05 PM      Result Value Ref Range   WBC 6.9  4.0 - 10.5 K/uL   RBC 4.41  3.87 - 5.11 MIL/uL   Hemoglobin 10.4 (*) 12.0 - 15.0 g/dL   HCT 16.1 (*) 09.6 - 04.5 %   MCV 74.6 (*) 78.0 - 100.0 fL   MCH 23.6 (*) 26.0 - 34.0 pg   MCHC 31.6  30.0 - 36.0 g/dL   RDW 40.9 (*) 81.1 - 91.4 %   Platelets 342  150 - 400 K/uL  HCG, QUANTITATIVE, PREGNANCY     Status: Abnormal   Collection Time    12/19/13  6:05 PM      Result Value Ref Range   hCG, Beta Chain, Quant, S 9840 (*) <5 mIU/mL    US Ob Comp Less 14 Wks  12/19/2013   CLINICAL DATA:  Abdominal pain. Approximately 8 weeks and 3 days pregnant by last menstrual period. The patient is unsure of the exact time of her last menstrual period.  EXAM: OBSTETRIC <14 WK [place] AND TRANSVAGINAL OB US  TECHNIQUE: Both transabdominal and  transvaginal ultrasound examinations were performed for complete evaluation of the gestation as well as the maternal uterus, adnexal regions, and pelvic cul-de-sac. Transvaginal technique was performed to assess early pregnancy.  COMPARISON:  11/11/2012.  FINDINGS: Intrauterine gestational sac: Visualized/normal in shape.  Yolk sac:  Visualized/normal in shape.  Embryo:  Not visualized  Cardiac Activity: Not applicable  MSD:  10.6  mm   5 w   5  d               [place] EDC: 08/16/2014  Maternal uterus/adnexae: Normal appearing ovaries with a left corpus luteum noted. Moderate amount of subchorionic hemorrhage. No free peritoneal fluid.  IMPRESSION: 1. Intrauterine gestational sac and yolk sac with an estimated gestational age of [redacted] weeks and 5 days. 2. No fetal pole is visible at this time. Recommend follow-up quantitative B-HCG levels and follow-up US in 14 days to confirm and assess viability. This recommendation follows SRU consensus guidelines: Diagnostic Criteria for Nonviable Pregnancy Early in the First Trimester. Malva Limes Med 2013; 782:9562-13. 3. Moderate subchorionic hemorrhage.   Electronically Signed   By: Gordan Payment M.D.   On: 12/19/2013 20:13   US Ob Transvaginal  12/19/2013   CLINICAL DATA:  Abdominal pain. Approximately 8 weeks and 3 days pregnant by last menstrual period. The patient is unsure of the exact time of her last menstrual period.  EXAM: OBSTETRIC <14 WK [place] AND TRANSVAGINAL OB US  TECHNIQUE: Both transabdominal and transvaginal ultrasound examinations were performed for complete evaluation of the gestation as well as the maternal uterus, adnexal regions, and pelvic cul-de-sac. Transvaginal technique was performed to assess early pregnancy.  COMPARISON:  11/11/2012.  FINDINGS: Intrauterine gestational sac: Visualized/normal in shape.  Yolk sac:  Visualized/normal in shape.  Embryo:  Not visualized  Cardiac Activity: Not applicable  MSD:  10.6  mm   5 w   5  d               [place] EDC: 08/16/2014   Maternal uterus/adnexae: Normal appearing ovaries with a left corpus luteum noted. Moderate amount of subchorionic hemorrhage. No free peritoneal fluid.  IMPRESSION: 1. Intrauterine gestational sac and yolk sac with an estimated gestational age of  [redacted] weeks and 5 days. 2. No fetal pole is visible at this time. Recommend follow-up quantitative B-HCG levels and follow-up US in 14 days to confirm and assess viability. This recommendation follows SRU consensus guidelines: Diagnostic Criteria for Nonviable Pregnancy Early in the First Trimester. Malva Limes Engl J Med 2013; 409:8119-14; 369:1443-51. 3. Moderate subchorionic hemorrhage.   Electronically Signed   By: Gordan PaymentSteve  Reid M.D.   On: 12/19/2013 20:13    Assessment and Plan   1. Pregnancy related pelvic pain in first trimester, antepartum   2. Nausea/vomiting in pregnancy    Bleeding precautions First trimester danger signs reviewed Return to MAU as needed  Follow-up Information   Schedule an appointment as soon as possible [facility] [place]   Contact information:   [address] [place] [id number] [phone]       Tawnya CrookHogan, Serin Thornell Donovan 12/19/2013, 10:21 PM

## 2013-12-19 NOTE — Discharge Instructions (Signed)
Pregnancy - First Trimester  During sexual intercourse, millions of sperm go into the vagina. Only 1 sperm will penetrate and fertilize the female egg while it is in the Fallopian tube. One week later, the fertilized egg implants into the wall of the uterus. An embryo begins to develop into a baby. At 6 to 8 weeks, the eyes and face are formed and the heartbeat can be seen on ultrasound. At the end of 12 weeks (first trimester), all the baby's organs are formed. Now that you are pregnant, you will want to do everything you can to have a healthy baby. Two of the most important things are to get good prenatal care and follow your caregiver's instructions. Prenatal care is all the medical care you receive before the baby's birth. It is given to prevent, find, and treat problems during the pregnancy and childbirth.  PRENATAL EXAMS  · During prenatal visits, your weight, blood pressure, and urine are checked. This is done to make sure you are healthy and progressing normally during the pregnancy.  · A pregnant woman should gain 25 to 35 pounds during the pregnancy. However, if you are overweight or underweight, your caregiver will advise you regarding your weight.  · Your caregiver will ask and answer questions for you.  · Blood work, cervical cultures, other necessary tests, and a Pap test are done during your prenatal exams. These tests are done to check on your health and the probable health of your baby. Tests are strongly recommended and done for HIV with your permission. This is the virus that causes AIDS. These tests are done because medicines can be given to help prevent your baby from being born with this infection should you have been infected without knowing it. Blood work is also used to find out your blood type, previous infections, and follow your blood levels (hemoglobin).  · Low hemoglobin (anemia) is common during pregnancy. Iron and vitamins are given to help prevent this. Later in the pregnancy, blood  tests for diabetes will be done along with any other tests if any problems develop.  · You may need other tests to make sure you and the baby are doing well.  CHANGES DURING THE FIRST TRIMESTER   Your body goes through many changes during pregnancy. They vary from person to person. Talk to your caregiver about changes you notice and are concerned about. Changes can include:  · Your menstrual period stops.  · The egg and sperm carry the genes that determine what you look like. Genes from you and your partner are forming a baby. The female genes determine whether the baby is a boy or a girl.  · Your body increases in girth and you may feel bloated.  · Feeling sick to your stomach (nauseous) and throwing up (vomiting). If the vomiting is uncontrollable, call your caregiver.  · Your breasts will begin to enlarge and become tender.  · Your nipples may stick out more and become darker.  · The need to urinate more. Painful urination may mean you have a bladder infection.  · Tiring easily.  · Loss of appetite.  · Cravings for certain kinds of food.  · At first, you may gain or lose a couple of pounds.  · You may have changes in your emotions from day to day (excited to be pregnant or concerned something may go wrong with the pregnancy and baby).  · You may have more vivid and strange dreams.  HOME CARE INSTRUCTIONS   ·   It is very important to avoid all smoking, alcohol and non-prescribed drugs during your pregnancy. These affect the formation and growth of the baby. Avoid chemicals while pregnant to ensure the delivery of a healthy infant.  · Start your prenatal visits by the 12th week of pregnancy. They are usually scheduled monthly at first, then more often in the last 2 months before delivery. Keep your caregiver's appointments. Follow your caregiver's instructions regarding medicine use, blood and lab tests, exercise, and diet.  · During pregnancy, you are providing food for you and your baby. Eat regular, well-balanced  meals. Choose foods such as meat, fish, milk and other low fat dairy products, vegetables, fruits, and whole-grain breads and cereals. Your caregiver will tell you of the ideal weight gain.  · You can help morning sickness by keeping soda crackers at the bedside. Eat a couple before arising in the morning. You may want to use the crackers without salt on them.  · Eating 4 to 5 small meals rather than 3 large meals a day also may help the nausea and vomiting.  · Drinking liquids between meals instead of during meals also seems to help nausea and vomiting.  · A physical sexual relationship may be continued throughout pregnancy if there are no other problems. Problems may be early (premature) leaking of amniotic fluid from the membranes, vaginal bleeding, or belly (abdominal) pain.  · Exercise regularly if there are no restrictions. Check with your caregiver or physical therapist if you are unsure of the safety of some of your exercises. Greater weight gain will occur in the last 2 trimesters of pregnancy. Exercising will help:  · Control your weight.  · Keep you in shape.  · Prepare you for labor and delivery.  · Help you lose your pregnancy weight after you deliver your baby.  · Wear a good support or jogging bra for breast tenderness during pregnancy. This may help if worn during sleep too.  · Ask when prenatal classes are available. Begin classes when they are offered.  · Do not use hot tubs, steam rooms, or saunas.  · Wear your seat belt when driving. This protects you and your baby if you are in an accident.  · Avoid raw meat, uncooked cheese, cat litter boxes, and soil used by cats throughout the pregnancy. These carry germs that can cause birth defects in the baby.  · The first trimester is a good time to visit your dentist for your dental health. Getting your teeth cleaned is okay. Use a softer toothbrush and brush gently during pregnancy.  · Ask for help if you have financial, counseling, or nutritional needs  during pregnancy. Your caregiver will be able to offer counseling for these needs as well as refer you for other special needs.  · Do not take any medicines or herbs unless told by your caregiver.  · Inform your caregiver if there is any mental or physical domestic violence.  · Make a list of emergency phone numbers of family, friends, hospital, and police and fire departments.  · Write down your questions. Take them to your prenatal visit.  · Do not douche.  · Do not cross your legs.  · If you have to stand for long periods of time, rotate you feet or take small steps in a circle.  · You may have more vaginal secretions that may require a sanitary pad. Do not use tampons or scented sanitary pads.  MEDICINES AND DRUG USE IN PREGNANCY  ·   Take prenatal vitamins as directed. The vitamin should contain 1 milligram of folic acid. Keep all vitamins out of reach of children. Only a couple vitamins or tablets containing iron may be fatal to a baby or young child when ingested.  · Avoid use of all medicines, including herbs, over-the-counter medicines, not prescribed or suggested by your caregiver. Only take over-the-counter or prescription medicines for pain, discomfort, or fever as directed by your caregiver. Do not use aspirin, ibuprofen, or naproxen unless directed by your caregiver.  · Let your caregiver also know about herbs you may be using.  · Alcohol is related to a number of birth defects. This includes fetal alcohol syndrome. All alcohol, in any form, should be avoided completely. Smoking will cause low birth rate and premature babies.  · Street or illegal drugs are very harmful to the baby. They are absolutely forbidden. A baby born to an addicted mother will be addicted at birth. The baby will go through the same withdrawal an adult does.  · Let your caregiver know about any medicines that you have to take and for what reason you take them.  SEEK MEDICAL CARE IF:   You have any concerns or worries during your  pregnancy. It is better to call with your questions if you feel they cannot wait, rather than worry about them.  SEEK IMMEDIATE MEDICAL CARE IF:   · An unexplained oral temperature above 102° F (38.9° C) develops, or as your caregiver suggests.  · You have leaking of fluid from the vagina (birth canal). If leaking membranes are suspected, take your temperature and inform your caregiver of this when you call.  · There is vaginal spotting or bleeding. Notify your caregiver of the amount and how many pads are used.  · You develop a bad smelling vaginal discharge with a change in the color.  · You continue to feel sick to your stomach (nauseated) and have no relief from remedies suggested. You vomit blood or coffee ground-like materials.  · You lose more than 2 pounds of weight in 1 week.  · You gain more than 2 pounds of weight in 1 week and you notice swelling of your face, hands, feet, or legs.  · You gain 5 pounds or more in 1 week (even if you do not have swelling of your hands, face, legs, or feet).  · You get exposed to German measles and have never had them.  · You are exposed to fifth disease or chickenpox.  · You develop belly (abdominal) pain. Round ligament discomfort is a common non-cancerous (benign) cause of abdominal pain in pregnancy. Your caregiver still must evaluate this.  · You develop headache, fever, diarrhea, pain with urination, or shortness of breath.  · You fall or are in a car accident or have any kind of trauma.  · There is mental or physical violence in your home.  Document Released: 09/19/2001 Document Revised: 06/19/2012 Document Reviewed: 03/23/2009  Exit[employer]® Patient Information ©2014 [employer].

## 2013-12-19 NOTE — MAU Note (Signed)
Pt states had full blown period 10/21/2013, on 11/09/2013 had spotting only. Here today with back pain and lower abd cramping. Had +upt at home 11/10/2013 weeks ago. Denies abnormal vaginal discharge. Has been nauseated beginning around one week ago. Also has been vomiting.

## 2013-12-19 NOTE — MAU Provider Note (Signed)
Attestation of Attending Supervision of Advanced Practitioner (PA/CNM/NP): Evaluation and management procedures were performed by the Advanced Practitioner under my supervision and collaboration.  I have reviewed the Advanced Practitioner's note and chart, and I agree with the management and plan.  Reva BoresPRATT,Callista Hoh S, MD [facility] Attending 12/19/2013 11:45 PM

## 2014-01-15 ENCOUNTER — Inpatient Hospital Stay (HOSPITAL_COMMUNITY): Payer: Medicaid Other

## 2014-01-15 ENCOUNTER — Inpatient Hospital Stay (HOSPITAL_COMMUNITY)
Admission: AD | Admit: 2014-01-15 | Discharge: 2014-01-15 | Disposition: A | Discharge status: Home / Self Care | Payer: Medicaid Other | Source: Ambulatory Visit | Attending: Obstetrics | Admitting: Obstetrics

## 2014-01-15 ENCOUNTER — Encounter (HOSPITAL_COMMUNITY): Payer: Self-pay | Admitting: *Deleted

## 2014-01-15 DIAGNOSIS — F17200 Nicotine dependence, unspecified, uncomplicated: Secondary | ICD-10-CM | POA: Insufficient documentation

## 2014-01-15 DIAGNOSIS — N949 Unspecified condition associated with female genital organs and menstrual cycle: Secondary | ICD-10-CM | POA: Insufficient documentation

## 2014-01-15 DIAGNOSIS — R011 Cardiac murmur, unspecified: Secondary | ICD-10-CM | POA: Insufficient documentation

## 2014-01-15 DIAGNOSIS — D649 Anemia, unspecified: Secondary | ICD-10-CM | POA: Insufficient documentation

## 2014-01-15 DIAGNOSIS — R109 Unspecified abdominal pain: Secondary | ICD-10-CM | POA: Insufficient documentation

## 2014-01-15 DIAGNOSIS — R51 Headache: Secondary | ICD-10-CM | POA: Insufficient documentation

## 2014-01-15 DIAGNOSIS — O99891 Other specified diseases and conditions complicating pregnancy: Secondary | ICD-10-CM | POA: Insufficient documentation

## 2014-01-15 DIAGNOSIS — M549 Dorsalgia, unspecified: Secondary | ICD-10-CM | POA: Insufficient documentation

## 2014-01-15 DIAGNOSIS — O9989 Other specified diseases and conditions complicating pregnancy, childbirth and the puerperium: Principal | ICD-10-CM

## 2014-01-15 DIAGNOSIS — O26899 Other specified pregnancy related conditions, unspecified trimester: Secondary | ICD-10-CM

## 2014-01-15 LAB — WET PREP, GENITAL
Clue Cells Wet Prep HPF POC: NONE SEEN
Trich, Wet Prep: NONE SEEN
Yeast Wet Prep HPF POC: NONE SEEN

## 2014-01-15 LAB — CBC WITH DIFFERENTIAL/PLATELET
BASOS PCT: 1 % (ref 0–1)
Basophils Absolute: 0 10*3/uL (ref 0.0–0.1)
EOS ABS: 0.2 10*3/uL (ref 0.0–0.7)
Eosinophils Relative: 3 % (ref 0–5)
HCT: 31.7 % — ABNORMAL LOW (ref 36.0–46.0)
Hemoglobin: 10.3 g/dL — ABNORMAL LOW (ref 12.0–15.0)
Lymphocytes Relative: 31 % (ref 12–46)
Lymphs Abs: 1.8 10*3/uL (ref 0.7–4.0)
MCH: 24.8 pg — AB (ref 26.0–34.0)
MCHC: 32.5 g/dL (ref 30.0–36.0)
MCV: 76.2 fL — ABNORMAL LOW (ref 78.0–100.0)
Monocytes Absolute: 0.3 10*3/uL (ref 0.1–1.0)
Monocytes Relative: 5 % (ref 3–12)
NEUTROS PCT: 61 % (ref 43–77)
Neutro Abs: 3.6 10*3/uL (ref 1.7–7.7)
PLATELETS: 261 10*3/uL (ref 150–400)
RBC: 4.16 MIL/uL (ref 3.87–5.11)
RDW: 19.9 % — ABNORMAL HIGH (ref 11.5–15.5)
WBC: 5.8 10*3/uL (ref 4.0–10.5)

## 2014-01-15 LAB — URINALYSIS, ROUTINE W REFLEX MICROSCOPIC
BILIRUBIN URINE: NEGATIVE
Glucose, UA: NEGATIVE mg/dL
Hgb urine dipstick: NEGATIVE
KETONES UR: NEGATIVE mg/dL
Leukocytes, UA: NEGATIVE
NITRITE: NEGATIVE
Protein, ur: NEGATIVE mg/dL
UROBILINOGEN UA: 0.2 mg/dL (ref 0.0–1.0)
pH: 6 (ref 5.0–8.0)

## 2014-01-15 NOTE — Discharge Instructions (Signed)
Your ultrasound today shows that you are 9 weeks and 4 days pregnant. There is a small area of bleeding noted similar to what was on the previous ultrasound. Start your prenatal care as scheduled. Return for heavy bleeding or other problems.  Abdominal Pain During Pregnancy Abdominal pain is common in pregnancy. Most of the time, it does not cause harm. There are many causes of abdominal pain. Some causes are more serious than others. Some of the causes of abdominal pain in pregnancy are easily diagnosed. Occasionally, the diagnosis takes time to understand. Other times, the cause is not determined. Abdominal pain can be a sign that something is very wrong with the pregnancy, or the pain may have nothing to do with the pregnancy at all. For this reason, always tell your health care provider if you have any abdominal discomfort. HOME CARE INSTRUCTIONS  Monitor your abdominal pain for any changes. The following actions may help to alleviate any discomfort you are experiencing:  Do not have sexual intercourse or put anything in your vagina until your symptoms go away completely.  Get plenty of rest until your pain improves.  Drink clear fluids if you feel nauseous. Avoid solid food as long as you are uncomfortable or nauseous.  Only take over-the-counter or prescription medicine as directed by your health care provider.  Keep all follow-up appointments with your health care provider. SEEK IMMEDIATE MEDICAL CARE IF:  You are bleeding, leaking fluid, or passing tissue from the vagina.  You have increasing pain or cramping.  You have persistent vomiting.  You have painful or bloody urination.  You have a fever.  You notice a decrease in your baby's movements.  You have extreme weakness or feel faint.  You have shortness of breath, with or without abdominal pain.  You develop a severe headache with abdominal pain.  You have abnormal vaginal discharge with abdominal pain.  You have  persistent diarrhea.  You have abdominal pain that continues even after rest, or gets worse. MAKE SURE YOU:   Understand these instructions.  Will watch your condition.  Will get help right away if you are not doing well or get worse. Document Released: 09/25/2005 Document Revised: 07/16/2013 Document Reviewed: 04/24/2013 [facility] Patient Information 2014 [employer] [place].

## 2014-01-15 NOTE — MAU Provider Note (Signed)
CSN: [id number]     Arrival date & time 01/15/14  0857 History   None    Chief Complaint  Patient presents with  . Abdominal Pain  . Back Pain     (Consider location/radiation/quality/duration/timing/severity/associated sxs/prior Treatment) Patient is a 28 y.o. female presenting with abdominal pain and back pain. The history is provided by the patient.  Abdominal Pain The primary symptoms of the illness include abdominal pain and nausea. The primary symptoms of the illness do not include fever, vomiting, diarrhea, dysuria, vaginal discharge or vaginal bleeding. The current episode started 13 to 24 hours ago. The onset of the illness was gradual.  The patient states that she believes she is currently pregnant. The patient has not had a change in bowel habit. Additional symptoms associated with the illness include chills, heartburn, frequency and back pain. Symptoms associated with the illness do not include anorexia, constipation, urgency or hematuria.  Back Pain  Associated symptoms include headaches, abdominal pain and pelvic pain. Pertinent negatives include no fever, no numbness, no dysuria and no weakness.   Robin Hodge is a 28 y.o. [phone] @ 2526w4d by her LMP presents to the ED with abdominal pain that she describes as cramping that started last night. She has not started her prenatal care but has an appointment with [facility] next week. She was evaluated here 12/19/2013 and had an ultrasound that showed a 5 week 5 day IUGS with YS. By her LMP she would have been [redacted] weeks gestation at that time. The ultrasound did show a SCH.  The patient reports no bleeding. Being still makes the pain better, pain is worse with movement.   Past Medical History  Diagnosis Date  . Heart murmur   . Abnormal Pap smear     colpo-ok since  . Obese   . Anemia    Past Surgical History  Procedure Laterality Date  . Dilation and curettage of uterus     Family History  Problem Relation Age of Onset  .  Hypertension Mother   . Diabetes Mother   . Heart disease Mother   . Heart disease Father   . Other Neg Hx    History  Substance Use Topics  . Smoking status: Current Every Day Smoker -- 0.25 packs/day for 3 years    Types: Cigarettes  . Smokeless tobacco: Never Used  . Alcohol Use: No   OB History   Grav Para Term Preterm Abortions TAB SAB Ect Mult Living   6 3 3  2  0 2   3     Review of Systems  Constitutional: Positive for chills. Negative for fever.  HENT: Negative for congestion, dental problem, ear pain, facial swelling, sinus pressure and sore throat.   Eyes: Negative for photophobia, pain, discharge and visual disturbance.  Respiratory: Negative for cough, chest tightness and wheezing.   Gastrointestinal: Positive for heartburn, nausea and abdominal pain. Negative for vomiting, diarrhea, constipation, abdominal distention and anorexia.  Genitourinary: Positive for frequency and pelvic pain. Negative for dysuria, urgency, hematuria, flank pain, vaginal bleeding, vaginal discharge and difficulty urinating.  Musculoskeletal: Positive for back pain. Negative for gait problem, myalgias, neck pain and neck stiffness.  Skin: Negative for color change and rash.  Neurological: Positive for headaches. Negative for dizziness, speech difficulty, weakness, light-headedness and numbness.  Psychiatric/Behavioral: Negative for confusion. The patient is not nervous/anxious.       Allergies  Review of patient's allergies indicates no known allergies.  Home Medications  No current outpatient  prescriptions on file. BP 143/70  Pulse 101  Temp(Src) 98.4 F (36.9 C) (Oral)  Resp 18  Ht 5\' 6"  (1.676 m)  Wt 295 lb 3.2 oz (133.902 kg)  BMI 47.67 kg/m2  SpO2 100%  LMP 10/21/2013 Physical Exam  Nursing note and vitals reviewed. Constitutional: She is oriented to person, place, and time. No distress.  Morbidly obese  HENT:  Head: Normocephalic.  Eyes: EOM are normal.  Neck: Neck  supple.  Cardiovascular: Normal rate.   Pulmonary/Chest: Effort normal.  Abdominal: Soft. There is tenderness in the right lower quadrant, suprapubic area and left lower quadrant. There is no rigidity, no rebound, no guarding and no CVA tenderness.  Obese   Genitourinary:  External genitalia without lesions, cervix closed, mild CMT, no adnexal tenderness. Uterus approximately 10 week size.   Musculoskeletal: Normal range of motion.  Neurological: She is alert and oriented to person, place, and time. No cranial nerve deficit.  Skin: Skin is warm and dry.  Psychiatric: She has a normal mood and affect. Her behavior is normal.   Results for orders placed during the hospital encounter of 01/15/14 (from the past 24 hour(s))  URINALYSIS, ROUTINE W REFLEX MICROSCOPIC     Status: Abnormal   Collection Time    01/15/14  9:16 AM      Result Value Ref Range   Color, Urine YELLOW  YELLOW   APPearance CLEAR  CLEAR   Specific Gravity, Urine >1.030 (*) 1.005 - 1.030   pH 6.0  5.0 - 8.0   Glucose, UA NEGATIVE  NEGATIVE mg/dL   Hgb urine dipstick NEGATIVE  NEGATIVE   Bilirubin Urine NEGATIVE  NEGATIVE   Ketones, ur NEGATIVE  NEGATIVE mg/dL   Protein, ur NEGATIVE  NEGATIVE mg/dL   Urobilinogen, UA 0.2  0.0 - 1.0 mg/dL   Nitrite NEGATIVE  NEGATIVE   Leukocytes, UA NEGATIVE  NEGATIVE  CBC WITH DIFFERENTIAL     Status: Abnormal   Collection Time    01/15/14  9:45 AM      Result Value Ref Range   WBC 5.8  4.0 - 10.5 K/uL   RBC 4.16  3.87 - 5.11 MIL/uL   Hemoglobin 10.3 (*) 12.0 - 15.0 g/dL   HCT 16.1 (*) 09.6 - 04.5 %   MCV 76.2 (*) 78.0 - 100.0 fL   MCH 24.8 (*) 26.0 - 34.0 pg   MCHC 32.5  30.0 - 36.0 g/dL   RDW 40.9 (*) 81.1 - 91.4 %   Platelets 261  150 - 400 K/uL   Neutrophils Relative % 61  43 - 77 %   Neutro Abs 3.6  1.7 - 7.7 K/uL   Lymphocytes Relative 31  12 - 46 %   Lymphs Abs 1.8  0.7 - 4.0 K/uL   Monocytes Relative 5  3 - 12 %   Monocytes Absolute 0.3  0.1 - 1.0 K/uL    Eosinophils Relative 3  0 - 5 %   Eosinophils Absolute 0.2  0.0 - 0.7 K/uL   Basophils Relative 1  0 - 1 %   Basophils Absolute 0.0  0.0 - 0.1 K/uL  WET PREP, GENITAL     Status: Abnormal   Collection Time    01/15/14 10:00 AM      Result Value Ref Range   Yeast Wet Prep HPF POC NONE SEEN  NONE SEEN   Trich, Wet Prep NONE SEEN  NONE SEEN   Clue Cells Wet Prep HPF POC NONE SEEN  NONE  SEEN   WBC, Wet Prep HPF POC FEW (*) NONE SEEN    ED Course  Procedures  US Ob Transvaginal  01/15/2014   CLINICAL DATA:  Cramping.  EXAM: TRANSVAGINAL OB ULTRASOUND  TECHNIQUE: Transvaginal ultrasound was performed for complete evaluation of the gestation as well as the maternal uterus, adnexal regions, and pelvic cul-de-sac.  COMPARISON:  12/19/2013  FINDINGS: Intrauterine gestational sac: Visualized/normal in shape.  Yolk sac:  Yes  Embryo:  Yes  Cardiac Activity: Yes  Heart Rate: 160 bpm  CRL:   2.7 cm 9 w 4 d                  [place] EDC: 08/16/2014  Maternal uterus/adnexae:  Subchorionic hemorrhage: There is a small subchorionic hemorrhage. This measures 3.6 x 1.4 x 3.2 cm.  Right ovary: Normal  Left ovary: There is a cyst measuring 3.5 x 5.0 x 3.2 cm.  Other :None  Free fluid:  No free fluid  IMPRESSION: 1. Single living intrauterine gestation. The estimated gestational age is 9 weeks and 4 days. This is concordant with the gestational age from ultrasound dated 12/19/2013. 2. Small subchorionic hemorrhage.   Electronically Signed   By: Signa Kell M.D.   On: 01/15/2014 11:50    MDM  28 y.o. female @ 105w5d gestation with abdominal pain. Stable for discharge with normal labs and ultrasound. She will take tylenol for pain and return as needed for problems. I have reviewed this patient's vital signs, nurses notes, appropriate labs and imaging.  I have discussed findings with the patient and plan of care. She voices understanding.    Medication List    ASK your doctor about these medications       acetaminophen  500 MG tablet  Commonly known as:  TYLENOL  Take 1,000 mg by mouth every 6 (six) hours as needed for mild pain.     prenatal multivitamin Tabs tablet  Take 1 tablet by mouth daily.     promethazine 12.5 MG tablet  Commonly known as:  PHENERGAN  Take 1 tablet (12.5 mg total) by mouth once.

## 2014-01-15 NOTE — MAU Note (Signed)
Patient presents to MAU with c/o cramping lower abdominal and back pain that started last night after working. Denies and VB at this time.

## 2014-01-16 LAB — GC/CHLAMYDIA PROBE AMP
CT PROBE, AMP APTIMA: NEGATIVE
GC PROBE AMP APTIMA: NEGATIVE

## 2014-03-10 ENCOUNTER — Encounter (HOSPITAL_COMMUNITY): Payer: Self-pay | Admitting: Emergency Medicine

## 2014-03-10 ENCOUNTER — Emergency Department (HOSPITAL_COMMUNITY)
Admission: EM | Admit: 2014-03-10 | Discharge: 2014-03-11 | Disposition: A | Discharge status: Home / Self Care | Payer: Medicaid Other | Attending: Emergency Medicine | Admitting: Emergency Medicine

## 2014-03-10 DIAGNOSIS — E669 Obesity, unspecified: Secondary | ICD-10-CM | POA: Insufficient documentation

## 2014-03-10 DIAGNOSIS — K0889 Other specified disorders of teeth and supporting structures: Secondary | ICD-10-CM

## 2014-03-10 DIAGNOSIS — R011 Cardiac murmur, unspecified: Secondary | ICD-10-CM | POA: Insufficient documentation

## 2014-03-10 DIAGNOSIS — K029 Dental caries, unspecified: Secondary | ICD-10-CM | POA: Insufficient documentation

## 2014-03-10 DIAGNOSIS — F17200 Nicotine dependence, unspecified, uncomplicated: Secondary | ICD-10-CM | POA: Insufficient documentation

## 2014-03-10 NOTE — ED Notes (Signed)
Presents with dental caries and dental pain began Saturday associated with mild jaw swelling. Pt is [redacted] weeks pregnant, denies vagianl pain, discharge, bleeding. Denies abdominal pain.

## 2014-03-11 MED ORDER — PENICILLIN V POTASSIUM 500 MG PO TABS: 500.0000 mg | ORAL_TABLET | Freq: Three times a day (TID) | ORAL | Status: DC

## 2014-03-11 MED ORDER — PENICILLIN V POTASSIUM 250 MG PO TABS
500.0000 mg | ORAL_TABLET | Freq: Once | ORAL | Status: AC
  Administered 2014-03-11: 500 mg via ORAL
  Filled 2014-03-11: qty 2

## 2014-03-11 NOTE — ED Notes (Signed)
Pt requested tylenol, provider informed. Unable to meet pt's request as pt has already exceeded the maximum amount of 4,00 mg of tylenol today. Provider informed this RN to wait until this morning until she has anymore tylenol.

## 2014-03-11 NOTE — ED Provider Notes (Signed)
Medical screening examination/treatment/procedure(s) were performed by non-physician practitioner and as supervising physician I was immediately available for consultation/collaboration.   EKG Interpretation None        Dyron Kawano M Carlous Olivares, MD 03/11/14 0508 

## 2014-03-11 NOTE — Discharge Instructions (Signed)
Please read and follow all provided instructions.  Your diagnoses today include:  1. Pain, dental    The exam and treatment you received today has been provided on an emergency basis only. This is not a substitute for complete medical or dental care.  Tests performed today include:  Vital signs. See below for your results today.   Medications prescribed:   Penicillin - antibiotic  You have been prescribed an antibiotic medicine: take the entire course of medicine even if you are feeling better. Stopping early can cause the antibiotic not to work.  Take any prescribed medications only as directed.  Home care instructions:  Follow any educational materials contained in this packet.  Follow-up instructions: Please follow-up with your dentist for further evaluation of your symptoms. If you do not have a dentist or primary care doctor -- see below for referral information.   Dental Assistance: See below for dental referrals  Return instructions:   Please return to the Emergency Department if you experience worsening symptoms.  Please return if you develop a fever, you develop more swelling in your face or neck, you have trouble breathing or swallowing food.  Please return if you have any other emergent concerns.  Additional Information:  Your vital signs today were: BP 142/84   Pulse 92   Temp(Src) 98.6 F (37 C)   Resp 20   Wt 295 lb 9 oz (134.066 kg)   SpO2 100%   LMP 10/21/2013 If your blood pressure (BP) was elevated above 135/85 this visit, please have this repeated by your doctor within one month. -------------- Dental Care: Organization         Address  Phone  Notes  [facility] Department of [facility] [address] [place], [place] [phone] Accepts children up to age 110 who are enrolled in [place] or [place] Health Choice; pregnant women with a Medicaid card; and children who have applied for Medicaid or [place] Health Choice, but were  declined, whose parents can pay a reduced fee at time of service.  [facility] Department of [facility]  [address], [place] [phone] Accepts children up to age 82 who are enrolled in [place] or [place] Health Choice; pregnant women with a Medicaid card; and children who have applied for Medicaid or [place] Health Choice, but were declined, whose parents can pay a reduced fee at time of service.  [facility]  [address] [place], [place] [phone] Patients are seen by appointment only. Walk-ins are not accepted. [facility] will see patients 53 years of age and older. Monday - Tuesday (8am-5pm) Most Wednesdays (8:30-5pm) $30 per visit, cash only  [facility]  [address], [facility] [phone] Patients are seen by appointment only. Walk-ins are not accepted. [facility] will see patients 69 years of age and older. One Wednesday Evening (Monthly: Volunteer Based).  $30 per visit, cash only  [employer] [facility] [employer]  [phone] for adults; Children under age 59, call Graduate Pediatric Dentistry at [phone]. Children aged 22-14, please call [phone] to request a pediatric application.  Dental services are provided in all areas of dental care including fillings, crowns and bridges, complete and partial dentures, implants, gum treatment, root canals, and extractions. Preventive care is also provided. Treatment is provided to both adults and children. Patients are selected via a lottery and there is often a waiting list.  [facility] [address], [place]  [phone] [email or web address]   [facility] [address], [place], [place] [phone], Ext. [phone] Second and Fourth Thursday of each month, opens at 6:30 AM; Clinic ends at 9 AM.  Patients are seen on a first-come first-served basis, and a limited number are seen during each clinic.    [facility]  [address], [place], [place] [phone]   Eligibility Requirements You must have lived in [place], [place], or [place] counties for at least the last three months.   You cannot be eligible for state or federal sponsored [place] insurance, including [employer], [place], or [employer]icare.   You generally cannot be eligible for healthcare insurance through your employer.    How to apply: Eligibility screenings are held every Tuesday and Wednesday afternoon from 1:00 pm until 4:00 pm. You do not need an appointment for the interview!  [facility] [address], [place], [place] [phone]   [facility]  [phone]   [facility]  [phone]   [facility]  [phone]

## 2014-03-11 NOTE — ED Provider Notes (Signed)
CSN: [id number]     Arrival date & time 03/10/14  2245 History   First MD Initiated Contact with Patient 03/10/14 2343     Chief Complaint  Patient presents with  . Dental Pain     (Consider location/radiation/quality/duration/timing/severity/associated sxs/prior Treatment) HPI Comments: Patient is currently [redacted] weeks pregnant presents with complaint of dental pain which began 3 days ago in her right upper jaw. Patient states that she has had mild gum swelling. No neck pain, swelling, trouble breathing or swallowing. No fever. Patient took 4 g of Tylenol today without relief. Onset of symptoms gradual. Course is constant. Nothing makes symptoms better or worse.  Patient is a 28 y.o. female presenting with tooth pain. The history is provided by the patient.  Dental Pain Associated symptoms: no facial swelling, no fever, no headaches and no neck pain     Past Medical History  Diagnosis Date  . Heart murmur   . Abnormal Pap smear     colpo-ok since  . Obese   . Anemia    Past Surgical History  Procedure Laterality Date  . Dilation and curettage of uterus     Family History  Problem Relation Age of Onset  . Hypertension Mother   . Diabetes Mother   . Heart disease Mother   . Heart disease Father   . Other Neg Hx    History  Substance Use Topics  . Smoking status: Current Every Day Smoker -- 0.25 packs/day for 3 years    Types: Cigarettes  . Smokeless tobacco: Never Used  . Alcohol Use: No   OB History   Grav Para Term Preterm Abortions TAB SAB Ect Mult Living   6 3 3  2  0 2   3     Review of Systems  Constitutional: Negative for fever.  HENT: Positive for dental problem. Negative for ear pain, facial swelling, sore throat and trouble swallowing.   Respiratory: Negative for shortness of breath and stridor.   Musculoskeletal: Negative for neck pain.  Skin: Negative for color change.  Neurological: Negative for headaches.   Allergies  Review of patient's allergies  indicates no known allergies.  Home Medications   Prior to Admission medications   Medication Sig Start Date End Date Taking? Authorizing Provider  acetaminophen (TYLENOL) 500 MG tablet Take 1,000 mg by mouth every 6 (six) hours as needed for mild pain.    Historical Provider, MD  penicillin v potassium (VEETID) 500 MG tablet Take 1 tablet (500 mg total) by mouth 3 (three) times daily. 03/11/14   Renne Crigler, PA-C  Prenatal Vit-Fe Fumarate-FA (PRENATAL MULTIVITAMIN) TABS Take 1 tablet by mouth daily.    Historical Provider, MD  promethazine (PHENERGAN) 12.5 MG tablet Take 1 tablet (12.5 mg total) by mouth once. 12/19/13   Heather Alger Memos, CNM   BP 142/84  Pulse 92  Temp(Src) 98.6 F (37 C)  Resp 20  Wt 295 lb 9 oz (134.066 kg)  SpO2 100%  LMP 10/21/2013  Physical Exam  Nursing note and vitals reviewed. Constitutional: She appears well-developed and well-nourished.  HENT:  Head: Normocephalic and atraumatic.  Right Ear: Tympanic membrane, external ear and ear canal normal.  Left Ear: Tympanic membrane, external ear and ear canal normal.  Nose: Nose normal.  Mouth/Throat: Uvula is midline, oropharynx is clear and moist and mucous membranes are normal. No trismus in the jaw. Abnormal dentition. Dental caries present. No dental abscesses or uvula swelling. No tonsillar abscesses.  Patient with R maxillary  tooth pain and tenderness to palpation in area of second molar. The tooth is decayed and broken almost to gum line. Mild swelling or erythema noted on exam.  Eyes: Conjunctivae are normal.  Neck: Normal range of motion. Neck supple.  No neck swelling or Ludwig's angina  Lymphadenopathy:    She has no cervical adenopathy.  Neurological: She is alert.  Skin: Skin is warm and dry.  Psychiatric: She has a normal mood and affect.    ED Course  Procedures (including critical care time) Labs Review Labs Reviewed - No data to display  Imaging Review No results found.   EKG  Interpretation None      12:22 AM Patient seen and examined. Medications ordered.   Vital signs reviewed and are as follows: Filed Vitals:   03/10/14 2255  BP: 142/84  Pulse: 92  Temp: 98.6 F (37 C)  Resp: 20    Patient will need to use tylenol for pain. Told not to take additional dose until the morning. Patient counseled to take prescribed medications as directed, return with worsening facial or neck swelling, and to follow-up with their dentist as soon as possible.    MDM   Final diagnoses:  Pain, dental   Patient with toothache. No fever. Exam unconcerning for Ludwig's angina or other deep tissue infection in neck.   As there is gum swelling will treat with antibiotic. Will avoid narcotics and NSAID 2/2 pregnancy. Urged patient to follow-up with dentist.       Renne CriglerJoshua Markeesha Char, PA-C 03/11/14 0023

## 2014-03-18 LAB — OB RESULTS CONSOLE HEPATITIS B SURFACE ANTIGEN: Hepatitis B Surface Ag: NEGATIVE

## 2014-03-18 LAB — OB RESULTS CONSOLE ANTIBODY SCREEN: ANTIBODY SCREEN: NEGATIVE

## 2014-03-18 LAB — OB RESULTS CONSOLE RPR: RPR: NONREACTIVE

## 2014-03-18 LAB — OB RESULTS CONSOLE ABO/RH: RH Type: NEGATIVE

## 2014-03-18 LAB — OB RESULTS CONSOLE HIV ANTIBODY (ROUTINE TESTING): HIV: NONREACTIVE

## 2014-03-18 LAB — OB RESULTS CONSOLE RUBELLA ANTIBODY, IGM: Rubella: IMMUNE

## 2014-03-22 ENCOUNTER — Inpatient Hospital Stay (HOSPITAL_COMMUNITY)
Admission: AD | Admit: 2014-03-22 | Discharge: 2014-03-22 | Disposition: A | Discharge status: Home / Self Care | Payer: Medicaid Other | Source: Ambulatory Visit | Attending: Obstetrics and Gynecology | Admitting: Obstetrics and Gynecology

## 2014-03-22 ENCOUNTER — Encounter (HOSPITAL_COMMUNITY): Payer: Self-pay | Admitting: *Deleted

## 2014-03-22 DIAGNOSIS — O99330 Smoking (tobacco) complicating pregnancy, unspecified trimester: Secondary | ICD-10-CM | POA: Insufficient documentation

## 2014-03-22 DIAGNOSIS — O9989 Other specified diseases and conditions complicating pregnancy, childbirth and the puerperium: Principal | ICD-10-CM

## 2014-03-22 DIAGNOSIS — O99891 Other specified diseases and conditions complicating pregnancy: Secondary | ICD-10-CM | POA: Diagnosis present

## 2014-03-22 DIAGNOSIS — R42 Dizziness and giddiness: Secondary | ICD-10-CM | POA: Insufficient documentation

## 2014-03-22 DIAGNOSIS — E860 Dehydration: Secondary | ICD-10-CM | POA: Insufficient documentation

## 2014-03-22 DIAGNOSIS — M549 Dorsalgia, unspecified: Secondary | ICD-10-CM | POA: Diagnosis present

## 2014-03-22 LAB — URINALYSIS, ROUTINE W REFLEX MICROSCOPIC
BILIRUBIN URINE: NEGATIVE
Glucose, UA: NEGATIVE mg/dL
Hgb urine dipstick: NEGATIVE
KETONES UR: 15 mg/dL — AB
Leukocytes, UA: NEGATIVE
NITRITE: NEGATIVE
PROTEIN: NEGATIVE mg/dL
Specific Gravity, Urine: >1.03 — ABNORMAL HIGH (ref 1.005–1.030)
Urobilinogen, UA: 0.2 mg/dL (ref 0.0–1.0)
pH: 6 (ref 5.0–8.0)

## 2014-03-22 MED ORDER — LACTATED RINGERS IV BOLUS (SEPSIS)
1000.0000 mL | Freq: Once | INTRAVENOUS | Status: AC
  Administered 2014-03-22: 1000 mL via INTRAVENOUS

## 2014-03-22 MED ORDER — LACTATED RINGERS IV BOLUS (SEPSIS): 500.0000 mL | Freq: Once | INTRAVENOUS | Status: DC

## 2014-03-22 MED ORDER — LACTATED RINGERS IV SOLN: INTRAVENOUS | Status: DC

## 2014-03-22 MED ORDER — CYCLOBENZAPRINE HCL 10 MG PO TABS
10.0000 mg | ORAL_TABLET | Freq: Once | ORAL | Status: AC
  Administered 2014-03-22: 10 mg via ORAL
  Filled 2014-03-22: qty 1

## 2014-03-22 MED ORDER — CYCLOBENZAPRINE HCL 10 MG PO TABS: 10.0000 mg | ORAL_TABLET | Freq: Three times a day (TID) | ORAL | Status: DC | PRN

## 2014-03-22 NOTE — Discharge Instructions (Signed)
Dizziness Dizziness is a common problem. It is a feeling of unsteadiness or lightheadedness. You may feel like you are about to faint. Dizziness can lead to injury if you stumble or fall. A person of any age group can suffer from dizziness, but dizziness is more common in older adults. CAUSES  Dizziness can be caused by many different things, including:  Middle ear problems.  Standing for too long.  Infections.  An allergic reaction.  Aging.  An emotional response to something, such as the sight of blood.  Side effects of medicines.  Fatigue.  Problems with circulation or blood pressure.  Excess use of alcohol, medicines, or illegal drug use.  Breathing too fast (hyperventilation).  An arrhythmia or problems with your heart rhythm.  Low red blood cell count (anemia).  Pregnancy.  Vomiting, diarrhea, fever, or other illnesses that cause dehydration.  Diseases or conditions such as Parkinson's disease, high blood pressure (hypertension), diabetes, and thyroid problems.  Exposure to extreme heat. DIAGNOSIS  To find the cause of your dizziness, your caregiver may do a physical exam, lab tests, radiologic imaging scans, or an electrocardiography test (ECG).  TREATMENT  Treatment of dizziness depends on the cause of your symptoms and can vary greatly. HOME CARE INSTRUCTIONS   Drink enough fluids to keep your urine clear or pale yellow. This is especially important in very hot weather. In the elderly, it is also important in cold weather.  If your dizziness is caused by medicines, take them exactly as directed. When taking blood pressure medicines, it is especially important to get up slowly.  Rise slowly from chairs and steady yourself until you feel okay.  In the morning, first sit up on the side of the bed. When this seems okay, stand slowly while holding onto something until you know your balance is fine.  If you need to stand in one place for a long time, be sure to  move your legs often. Tighten and relax the muscles in your legs while standing.  If dizziness continues to be a problem, have someone stay with you for a day or two. Do this until you feel you are well enough to stay alone. Have the person call your caregiver if he or she notices changes in you that are concerning.  Do not drive or use heavy machinery if you feel dizzy.  Do not drink alcohol. SEEK IMMEDIATE MEDICAL CARE IF:   Your dizziness or lightheadedness gets worse.  You feel nauseous or vomit.  You develop problems with talking, walking, weakness, or using your arms, hands, or legs.  You are not thinking clearly or you have difficulty forming sentences. It may take a friend or family member to determine if your thinking is normal.  You develop chest pain, abdominal pain, shortness of breath, or sweating.  Your vision changes.  You notice any bleeding.  You have side effects from medicine that seems to be getting worse rather than better. MAKE SURE YOU:   Understand these instructions.  Will watch your condition.  Will get help right away if you are not doing well or get worse. Document Released: 03/21/2001 Document Revised: 12/18/2011 Document Reviewed: 04/14/2011 [facility] Patient Information 2014 [place], [place]. Sciatica Sciatica is pain, weakness, numbness, or tingling along the path of the sciatic nerve. The nerve starts in the lower back and runs down the back of each leg. The nerve controls the muscles in the lower leg and in the back of the knee, while also providing sensation  to the back of the thigh, lower leg, and the sole of your foot. Sciatica is a symptom of another medical condition. For instance, nerve damage or certain conditions, such as a herniated disk or bone spur on the spine, pinch or put pressure on the sciatic nerve. This causes the pain, weakness, or other sensations normally associated with sciatica. Generally, sciatica only affects one side of the  body. CAUSES   Herniated or slipped disc.  Degenerative disk disease.  A pain disorder involving the narrow muscle in the buttocks (piriformis syndrome).  Pelvic injury or fracture.  Pregnancy.  Tumor (rare). SYMPTOMS  Symptoms can vary from mild to very severe. The symptoms usually travel from the low back to the buttocks and down the back of the leg. Symptoms can include:  Mild tingling or dull aches in the lower back, leg, or hip.  Numbness in the back of the calf or sole of the foot.  Burning sensations in the lower back, leg, or hip.  Sharp pains in the lower back, leg, or hip.  Leg weakness.  Severe back pain inhibiting movement. These symptoms may get worse with coughing, sneezing, laughing, or prolonged sitting or standing. Also, being overweight may worsen symptoms. DIAGNOSIS  Your caregiver will perform a physical exam to look for common symptoms of sciatica. He or she may ask you to do certain movements or activities that would trigger sciatic nerve pain. Other tests may be performed to find the cause of the sciatica. These may include:  Blood tests.  X-rays.  Imaging tests, such as an MRI or CT scan. TREATMENT  Treatment is directed at the cause of the sciatic pain. Sometimes, treatment is not necessary and the pain and discomfort goes away on its own. If treatment is needed, your caregiver may suggest:  Over-the-counter medicines to relieve pain.  Prescription medicines, such as anti-inflammatory medicine, muscle relaxants, or narcotics.  Applying heat or ice to the painful area.  Steroid injections to lessen pain, irritation, and inflammation around the nerve.  Reducing activity during periods of pain.  Exercising and stretching to strengthen your abdomen and improve flexibility of your spine. Your caregiver may suggest losing weight if the extra weight makes the back pain worse.  Physical therapy.  Surgery to eliminate what is pressing or pinching  the nerve, such as a bone spur or part of a herniated disk. HOME CARE INSTRUCTIONS   Only take over-the-counter or prescription medicines for pain or discomfort as directed by your caregiver.  Apply ice to the affected area for 20 minutes, 3 4 times a day for the first 48 72 hours. Then try heat in the same way.  Exercise, stretch, or perform your usual activities if these do not aggravate your pain.  Attend physical therapy sessions as directed by your caregiver.  Keep all follow-up appointments as directed by your caregiver.  Do not wear high heels or shoes that do not provide proper support.  Check your mattress to see if it is too soft. A firm mattress may lessen your pain and discomfort. SEEK IMMEDIATE MEDICAL CARE IF:   You lose control of your bowel or bladder (incontinence).  You have increasing weakness in the lower back, pelvis, buttocks, or legs.  You have redness or swelling of your back.  You have a burning sensation when you urinate.  You have pain that gets worse when you lie down or awakens you at night.  Your pain is worse than you have experienced in the  past.  Your pain is lasting longer than 4 weeks.  You are suddenly losing weight without reason. MAKE SURE YOU:  Understand these instructions.  Will watch your condition.  Will get help right away if you are not doing well or get worse. Document Released: 09/19/2001 Document Revised: 03/26/2012 Document Reviewed: 02/04/2012 [facility] Patient Information 2014 [place], [place].

## 2014-03-22 NOTE — MAU Note (Signed)
Patient presents with complaint of dizziness on and off X 1 month.

## 2014-03-22 NOTE — MAU Provider Note (Signed)
History   Patient is a 28y.o. [id number] at 19wks who presents, unannounced, for dizziness and back pain.  Patient reports dizziness has been ongoing, but while at work today she had an incident when she felt like she would pass out.  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  Patient states that she attempted to drink water, but was still feeling faint.  Patient denies LOF, VB, abdominal cramping, or issues with urination. Patient states back pain has been ongoing since beginning of pregnant, which presents as constant stabbing pain. Patient reports that she has history of pinched sciatic nerve with previous pregnancy and was managed with percocet and muscle relaxants.  Patient reports taking tylenol today with no relief.   Patient Active Problem List   Diagnosis Date Noted  . Allergic rhinitis, seasonal 01/13/2013  . Essential hypertension, benign 01/13/2013  . Normal delivery 11/25/2012    Chief Complaint  Patient presents with  . Dizziness  . Back Pain   HPI  OB History   Grav Para Term Preterm Abortions TAB SAB Ect Mult Living   6 3 3  2  0 2   3      Past Medical History  Diagnosis Date  . Heart murmur   . Abnormal Pap smear     colpo-ok since  . Obese   . Anemia     Past Surgical History  Procedure Laterality Date  . Dilation and curettage of uterus      Family History  Problem Relation Age of Onset  . Hypertension Mother   . Diabetes Mother   . Heart disease Mother   . Heart disease Father   . Other Neg Hx     History  Substance Use Topics  . Smoking status: Current Every Day Smoker -- 0.25 packs/day for 3 years    Types: Cigarettes  . Smokeless tobacco: Never Used  . Alcohol Use: No    Allergies: No Known Allergies  Prescriptions prior to admission  Medication Sig Dispense Refill  . acetaminophen (TYLENOL) 500 MG tablet Take 1,000 mg by mouth every 6 (six) hours as needed for mild pain.      .  Prenatal Vit-Fe Fumarate-FA (PRENATAL MULTIVITAMIN) TABS Take 1 tablet by mouth daily.      . Pseudoeph-Doxylamine-DM-APAP (NYQUIL PO) Take 1 capsule by mouth at bedtime as needed (cold symptoms).      . penicillin v potassium (VEETID) 500 MG tablet Take 1 tablet (500 mg total) by mouth 3 (three) times daily.  21 tablet  0    ROS  See HPI Above Physical Exam   Blood pressure 132/70, pulse 109, temperature 98.8 F (37.1 C), temperature source Oral, resp. rate 18, height 5\' 7"  (1.702 m), weight 295 lb 9 oz (134.066 kg), last menstrual period 10/21/2013, not currently breastfeeding. Results for orders placed during the hospital encounter of 03/22/14 (from the past 24 hour(s))  URINALYSIS, ROUTINE W REFLEX MICROSCOPIC     Status: Abnormal   Collection Time    03/22/14  4:54 PM      Result Value Ref Range   Color, Urine YELLOW  YELLOW   APPearance HAZY (*) CLEAR   Specific Gravity, Urine >1.030 (*) 1.005 - 1.030   pH 6.0  5.0 - 8.0   Glucose, UA NEGATIVE  NEGATIVE mg/dL   Hgb urine dipstick NEGATIVE  NEGATIVE   Bilirubin Urine NEGATIVE  NEGATIVE   Ketones, ur 15 (*) NEGATIVE  mg/dL   Protein, ur NEGATIVE  NEGATIVE mg/dL   Urobilinogen, UA 0.2  0.0 - 1.0 mg/dL   Nitrite NEGATIVE  NEGATIVE   Leukocytes, UA NEGATIVE  NEGATIVE    Physical Exam  Constitutional: She appears well-developed and well-nourished.  Cardiovascular: Normal rate, regular rhythm and normal heart sounds.   Respiratory: Effort normal and breath sounds normal.  GI: Soft. Bowel sounds are normal.  Musculoskeletal: Normal range of motion.       Lumbar back: She exhibits tenderness.  Neurological: She is alert.  Skin: Skin is warm and dry.   FHR: 142 by doppler  ED Course  Assessment: IUP at 19wks Dizziness Back Pain Desires BTL  Plan: -IV Bolus -Flexeril for back pain -Discussed proper nutrition and hydration during pregnancy: frequent PO intake, 3 meals and high protein snacks -Non-pharmacological comfort  measures for back pain discussed  -Patient to sign BTL consent papers at 28 week visit.  Follow Up (1842) -Patient reports no dizziness at current -Flexeril not given, yet -Will discharge to home with flexeril rx -Work excuse given for missed day and for need for frequent breaks for nutritional intake -Call if you have any questions or concerns prior to your next visit.  -Follow up in office as scheduled   Anayia Eugene LYNN CNM, MSN 03/22/2014 5:52 PM

## 2014-03-22 NOTE — Progress Notes (Signed)
S: feels some relief after taking flexeril, feels better after IVF's, not dizzy  O: VSS  A: IUP at 19wks Chronic back pain Mild dehydration   P: dc home rv'd comfort measures F/u as scheduled  rec chiropractic care for back pain  S.Jaycee Pelzer, CNM

## 2014-04-10 ENCOUNTER — Inpatient Hospital Stay (HOSPITAL_COMMUNITY)
Admission: AD | Admit: 2014-04-10 | Discharge: 2014-04-10 | Disposition: A | Discharge status: Home / Self Care | Payer: Medicaid Other | Source: Ambulatory Visit | Attending: Obstetrics and Gynecology | Admitting: Obstetrics and Gynecology

## 2014-04-10 ENCOUNTER — Encounter (HOSPITAL_COMMUNITY): Payer: Self-pay | Admitting: *Deleted

## 2014-04-10 DIAGNOSIS — Z6791 Unspecified blood type, Rh negative: Secondary | ICD-10-CM

## 2014-04-10 DIAGNOSIS — D649 Anemia, unspecified: Secondary | ICD-10-CM

## 2014-04-10 DIAGNOSIS — N760 Acute vaginitis: Secondary | ICD-10-CM | POA: Insufficient documentation

## 2014-04-10 DIAGNOSIS — M545 Low back pain, unspecified: Secondary | ICD-10-CM | POA: Insufficient documentation

## 2014-04-10 DIAGNOSIS — O09899 Supervision of other high risk pregnancies, unspecified trimester: Secondary | ICD-10-CM

## 2014-04-10 DIAGNOSIS — O360990 Maternal care for other rhesus isoimmunization, unspecified trimester, not applicable or unspecified: Secondary | ICD-10-CM | POA: Insufficient documentation

## 2014-04-10 DIAGNOSIS — O99330 Smoking (tobacco) complicating pregnancy, unspecified trimester: Secondary | ICD-10-CM | POA: Insufficient documentation

## 2014-04-10 DIAGNOSIS — O09892 Supervision of other high risk pregnancies, second trimester: Secondary | ICD-10-CM

## 2014-04-10 DIAGNOSIS — O2390 Unspecified genitourinary tract infection in pregnancy, unspecified trimester: Secondary | ICD-10-CM | POA: Insufficient documentation

## 2014-04-10 DIAGNOSIS — Z8249 Family history of ischemic heart disease and other diseases of the circulatory system: Secondary | ICD-10-CM | POA: Insufficient documentation

## 2014-04-10 DIAGNOSIS — Z283 Underimmunization status: Secondary | ICD-10-CM

## 2014-04-10 DIAGNOSIS — B9689 Other specified bacterial agents as the cause of diseases classified elsewhere: Secondary | ICD-10-CM | POA: Insufficient documentation

## 2014-04-10 DIAGNOSIS — B019 Varicella without complication: Secondary | ICD-10-CM | POA: Insufficient documentation

## 2014-04-10 DIAGNOSIS — A499 Bacterial infection, unspecified: Secondary | ICD-10-CM | POA: Insufficient documentation

## 2014-04-10 DIAGNOSIS — R109 Unspecified abdominal pain: Secondary | ICD-10-CM | POA: Insufficient documentation

## 2014-04-10 DIAGNOSIS — Z3490 Encounter for supervision of normal pregnancy, unspecified, unspecified trimester: Secondary | ICD-10-CM

## 2014-04-10 DIAGNOSIS — O26899 Other specified pregnancy related conditions, unspecified trimester: Secondary | ICD-10-CM

## 2014-04-10 DIAGNOSIS — O99019 Anemia complicating pregnancy, unspecified trimester: Secondary | ICD-10-CM | POA: Insufficient documentation

## 2014-04-10 LAB — URINALYSIS, ROUTINE W REFLEX MICROSCOPIC
BILIRUBIN URINE: NEGATIVE
Glucose, UA: NEGATIVE mg/dL
HGB URINE DIPSTICK: NEGATIVE
KETONES UR: 15 mg/dL — AB
Leukocytes, UA: NEGATIVE
Nitrite: NEGATIVE
Protein, ur: NEGATIVE mg/dL
UROBILINOGEN UA: 0.2 mg/dL (ref 0.0–1.0)
pH: 6 (ref 5.0–8.0)

## 2014-04-10 LAB — WET PREP, GENITAL
TRICH WET PREP: NONE SEEN
Yeast Wet Prep HPF POC: NONE SEEN

## 2014-04-10 MED ORDER — METRONIDAZOLE 500 MG PO TABS: 500.0000 mg | ORAL_TABLET | Freq: Two times a day (BID) | ORAL | Status: AC

## 2014-04-10 MED ORDER — IBUPROFEN 600 MG PO TABS
600.0000 mg | ORAL_TABLET | Freq: Once | ORAL | Status: AC
  Administered 2014-04-10: 600 mg via ORAL
  Filled 2014-04-10: qty 1

## 2014-04-10 MED ORDER — IBUPROFEN 600 MG PO TABS: 600.0000 mg | ORAL_TABLET | Freq: Four times a day (QID) | ORAL | Status: DC | PRN

## 2014-04-10 NOTE — Discharge Instructions (Signed)
Back Pain in Pregnancy °Back pain during pregnancy is common. It happens in about half of all pregnancies. It is important for you and your baby that you remain active during your pregnancy. If you feel that back pain is not allowing you to remain active or sleep well, it is time to see your caregiver. Back pain may be caused by several factors related to changes during your pregnancy. Fortunately, unless you had trouble with your back before your pregnancy, the pain is likely to get better after you deliver. °Low back pain usually occurs between the fifth and seventh months of pregnancy. It can, however, happen in the first couple months. Factors that increase the risk of back problems include:  °· Previous back problems. °· Injury to your back. °· Having twins or multiple births. °· A chronic cough. °· Stress. °· Job-related repetitive motions. °· Muscle or spinal disease in the back. °· Family history of back problems, ruptured (herniated) discs, or osteoporosis. °· Depression, anxiety, and panic attacks. °CAUSES  °· When you are pregnant, your body produces a hormone called relaxin. This hormone makes the ligaments connecting the low back and pubic bones more flexible. This flexibility allows the baby to be delivered more easily. When your ligaments are loose, your muscles need to work harder to support your back. Soreness in your back can come from tired muscles. Soreness can also come from back tissues that are irritated since they are receiving less support. °· As the baby grows, it puts pressure on the nerves and blood vessels in your pelvis. This can cause back pain. °· As the baby grows and gets heavier during pregnancy, the uterus pushes the stomach muscles forward and changes your center of gravity. This makes your back muscles work harder to maintain good posture. °SYMPTOMS  °Lumbar pain during pregnancy °Lumbar pain during pregnancy usually occurs at or above the waist in the center of the back. There  may be pain and numbness that radiates into your leg or foot. This is similar to low back pain experienced by non-pregnant women. It usually increases with sitting for long periods of time, standing, or repetitive lifting. Tenderness may also be present in the muscles along your upper back. °Posterior pelvic pain during pregnancy °Pain in the back of the pelvis is more common than lumbar pain in pregnancy. It is a deep pain felt in your side at the waistline, or across the tailbone (sacrum), or in both places. You may have pain on one or both sides. This pain can also go into the buttocks and backs of the upper thighs. Pubic and groin pain may also be present. The pain does not quickly resolve with rest, and morning stiffness may also be present. °Pelvic pain during pregnancy can be brought on by most activities. A high level of fitness before and during pregnancy may or may not prevent this problem. Labor pain is usually 1 to 2 minutes apart, lasts for about 1 minute, and involves a bearing down feeling or pressure in your pelvis. However, if you are at term with the pregnancy, constant low back pain can be the beginning of early labor, and you should be aware of this. °DIAGNOSIS  °X-rays of the back should not be done during the first 12 to 14 weeks of the pregnancy and only when absolutely necessary during the rest of the pregnancy. MRIs do not give off radiation and are safe during pregnancy. MRIs also should only be done when absolutely necessary. °HOME CARE INSTRUCTIONS °· Exercise   as directed by your caregiver. Exercise is the most effective way to prevent or manage back pain. If you have a back problem, it is especially important to avoid sports that require sudden body movements. Swimming and walking are great activities. °· Do not stand in one place for long periods of time. °· Do not wear high heels. °· Sit in chairs with good posture. Use a pillow on your lower back if necessary. Make sure your head  rests over your shoulders and is not hanging forward. °· Try sleeping on your side, preferably the left side, with a pillow or two between your legs. If you are sore after a night's rest, your bed may be too soft. Try placing a board between your mattress and box spring. °· Listen to your body when lifting. If you are experiencing pain, ask for help or try bending your knees more so you can use your leg muscles rather than your back muscles. Squat down when picking up something from the floor. Do not bend over. °· Eat a healthy diet. Try to gain weight within your caregiver's recommendations. °· Use heat or cold packs 3 to 4 times a day for 15 minutes to help with the pain. °· Only take over-the-counter or prescription medicines for pain, discomfort, or fever as directed by your caregiver. °Sudden (acute) back pain °· Use bed rest for only the most extreme, acute episodes of back pain. Prolonged bed rest over 48 hours will aggravate your condition. °· Ice is very effective for acute conditions. °¨ Put ice in a plastic bag. °¨ Place a towel between your skin and the bag. °¨ Leave the ice on for 10 to 20 minutes every 2 hours, or as needed. °· Using heat packs for 30 minutes prior to activities is also helpful. °Continued back pain °See your caregiver if you have continued problems. Your caregiver can help or refer you for appropriate physical therapy. With conditioning, most back problems can be avoided. Sometimes, a more serious issue may be the cause of back pain. You should be seen right away if new problems seem to be developing. Your caregiver may recommend: °· A maternity girdle. °· An elastic sling. °· A back brace. °· A massage therapist or acupuncture. °SEEK MEDICAL CARE IF:  °· You are not able to do most of your daily activities, even when taking the pain medicine you were given. °· You need a referral to a physical therapist or chiropractor. °· You want to try acupuncture. °SEEK IMMEDIATE MEDICAL CARE  IF: °· You develop numbness, tingling, weakness, or problems with the use of your arms or legs. °· You develop severe back pain that is no longer relieved with medicines. °· You have a sudden change in bowel or bladder control. °· You have increasing pain in other areas of the body. °· You develop shortness of breath, dizziness, or fainting. °· You develop nausea, vomiting, or sweating. °· You have back pain which is similar to labor pains. °· You have back pain along with your water breaking or vaginal bleeding. °· You have back pain or numbness that travels down your leg. °· Your back pain developed after you fell. °· You develop pain on one side of your back. You may have a kidney stone. °· You see blood in your urine. You may have a bladder infection or kidney stone. °· You have back pain with blisters. You may have shingles. °Back pain is fairly common during pregnancy but should not be accepted as just part of   the process. Back pain should always be treated as soon as possible. This will make your pregnancy as pleasant as possible. Document Released: 01/03/2006 Document Revised: 12/18/2011 Document Reviewed: 02/14/2011 [facility]Exit[facility] Patient Information 2015 [place] HeightsExitCare, [place]LL[place]. This information is not intended to replace advice given to you by your health care provider. Make sure you discuss any questions you have with your health care provider. Preterm Labor Information Preterm labor is when labor starts at less than 37 weeks of pregnancy. The normal length of a pregnancy is 39 to 41 weeks. CAUSES Often, there is no identifiable underlying cause as to why a woman goes into preterm labor. One of the most common known causes of preterm labor is infection. Infections of the uterus, cervix, vagina, amniotic sac, bladder, kidney, or even the lungs (pneumonia) can cause labor to start. Other suspected causes of preterm labor include:   Urogenital infections, such as yeast infections and bacterial vaginosis.    Uterine abnormalities (uterine shape, uterine septum, fibroids, or bleeding from the placenta).   A cervix that has been operated on (it may fail to stay closed).   Malformations in the fetus.   Multiple gestations (twins, triplets, and so on).   Breakage of the amniotic sac.  RISK FACTORS  Having a previous history of preterm labor.   Having premature rupture of membranes (PROM).   Having a placenta that covers the opening of the cervix (placenta previa).   Having a placenta that separates from the uterus (placental abruption).   Having a cervix that is too weak to hold the fetus in the uterus (incompetent cervix).   Having too much fluid in the amniotic sac (polyhydramnios).   Taking illegal drugs or smoking while pregnant.   Not gaining enough weight while pregnant.   Being younger than 5618 and older than 28 years old.   Having a low socioeconomic status.   Being African American. SYMPTOMS Signs and symptoms of preterm labor include:   Menstrual-like cramps, abdominal pain, or back pain.  Uterine contractions that are regular, as frequent as six in an hour, regardless of their intensity (may be mild or painful).  Contractions that start on the top of the uterus and spread down to the lower abdomen and back.   A sense of increased pelvic pressure.   A watery or bloody mucus discharge that comes from the vagina.  TREATMENT Depending on the length of the pregnancy and other circumstances, your health care provider may suggest bed rest. If necessary, there are medicines that can be given to stop contractions and to mature the fetal lungs. If labor happens before 34 weeks of pregnancy, a prolonged hospital stay may be recommended. Treatment depends on the condition of both you and the fetus.  WHAT SHOULD YOU DO IF YOU THINK YOU ARE IN PRETERM LABOR? Call your health care provider right away. You will need to go to the hospital to get checked  immediately. HOW CAN YOU PREVENT PRETERM LABOR IN FUTURE PREGNANCIES? You should:   Stop smoking if you smoke.  Maintain healthy weight gain and avoid chemicals and drugs that are not necessary.  Be watchful for any type of infection.  Inform your health care provider if you have a known history of preterm labor. Document Released: 12/16/2003 Document Revised: 05/28/2013 Document Reviewed: 10/28/2012 [facility] CenterExitCare Patient Information 2015 [place]ExitCare, [place]LL[place]. This information is not intended to replace advice given to you by your health care provider. Make sure you discuss any questions you have with your health care  provider. Bacterial Vaginosis Bacterial vaginosis is a vaginal infection that occurs when the normal balance of bacteria in the vagina is disrupted. It results from an overgrowth of certain bacteria. This is the most common vaginal infection in women of childbearing age. Treatment is important to prevent complications, especially in pregnant women, as it can cause a premature delivery. CAUSES  Bacterial vaginosis is caused by an increase in harmful bacteria that are normally present in smaller amounts in the vagina. Several different kinds of bacteria can cause bacterial vaginosis. However, the reason that the condition develops is not fully understood. RISK FACTORS Certain activities or behaviors can put you at an increased risk of developing bacterial vaginosis, including:  Having a new sex partner or multiple sex partners.  Douching.  Using an intrauterine device (IUD) for contraception. Women do not get bacterial vaginosis from toilet seats, bedding, swimming pools, or contact with objects around them. SIGNS AND SYMPTOMS  Some women with bacterial vaginosis have no signs or symptoms. Common symptoms include:  Grey vaginal discharge.  A fishlike odor with discharge, especially after sexual intercourse.  Itching or burning of the vagina and vulva.  Burning or pain with  urination. DIAGNOSIS  Your health care provider will take a medical history and examine the vagina for signs of bacterial vaginosis. A sample of vaginal fluid may be taken. Your health care provider will look at this sample under a microscope to check for bacteria and abnormal cells. A vaginal pH test may also be done.  TREATMENT  Bacterial vaginosis may be treated with antibiotic medicines. These may be given in the form of a pill or a vaginal cream. A second round of antibiotics may be prescribed if the condition comes back after treatment.  HOME CARE INSTRUCTIONS   Only take over-the-counter or prescription medicines as directed by your health care provider.  If antibiotic medicine was prescribed, take it as directed. Make sure you finish it even if you start to feel better.  Do not have sex until treatment is completed.  Tell all sexual partners that you have a vaginal infection. They should see their health care provider and be treated if they have problems, such as a mild rash or itching.  Practice safe sex by using condoms and only having one sex partner. SEEK MEDICAL CARE IF:   Your symptoms are not improving after 3 days of treatment.  You have increased discharge or pain.  You have a fever. MAKE SURE YOU:   Understand these instructions.  Will watch your condition.  Will get help right away if you are not doing well or get worse. FOR MORE INFORMATION  Centers for Disease Control and Prevention, Division of STD Prevention: [employer]s.co.zawww.cdc.gov/std American Sexual Health Association (ASHA): www.ashastd.org  Document Released: 09/25/2005 Document Revised: 07/16/2013 Document Reviewed: 05/07/2013 [facility]Exit[facility] Patient Information 2015 [place]ExitCare, [place]LL[place]. This information is not intended to replace advice given to you by your health care provider. Make sure you discuss any questions you have with your health care provider.

## 2014-04-10 NOTE — MAU Provider Note (Signed)
History   Pt present to MAU with cramping, lower abd and lower back pain x 3 days.  She reports nausea but no vomiting.  She denies vb or lof w/+fm.  Pt report completing her abx from 03/25/14  Patient Active Problem List   Diagnosis Date Noted  . Anemia 04/10/2014  . Susceptible to varicella (non-immune), currently pregnant 04/10/2014  . Rh negative state in antepartum period 04/10/2014  . Short interval between pregnancies affecting pregnancy in second trimester, antepartum 04/10/2014  . Dizziness 03/22/2014  . Back pain affecting pregnancy in first trimester 03/22/2014  . Allergic rhinitis, seasonal 01/13/2013  . Normal delivery 11/25/2012    Chief Complaint  Patient presents with  . Contractions  . Diarrhea   HPI  See HPI  OB History   Grav Para Term Preterm Abortions TAB SAB Ect Mult Living   6 3 3  2  0 2   3      Past Medical History  Diagnosis Date  . Heart murmur   . Abnormal Pap smear     colpo-ok since  . Obese   . Anemia     Past Surgical History  Procedure Laterality Date  . Dilation and curettage of uterus      Family History  Problem Relation Age of Onset  . Hypertension Mother   . Diabetes Mother   . Heart disease Mother   . Heart disease Father   . Other Neg Hx     History  Substance Use Topics  . Smoking status: Current Every Day Smoker -- 0.25 packs/day for 3 years    Types: Cigarettes  . Smokeless tobacco: Never Used  . Alcohol Use: No    Allergies: No Known Allergies  Prescriptions prior to admission  Medication Sig Dispense Refill  . acetaminophen (TYLENOL) 500 MG tablet Take 1,000 mg by mouth every 6 (six) hours as needed for mild pain.      . ferrous sulfate 325 (65 FE) MG tablet Take 325 mg by mouth daily.      . Prenatal Vit-Fe Fumarate-FA (PRENATAL MULTIVITAMIN) TABS Take 1 tablet by mouth daily.      . cyclobenzaprine (FLEXERIL) 10 MG tablet Take 1 tablet (10 mg total) by mouth 3 (three) times daily as needed for muscle  spasms.  30 tablet  0  . penicillin v potassium (VEETID) 500 MG tablet Take 1 tablet (500 mg total) by mouth 3 (three) times daily.  21 tablet  0    Review of Systems  Constitutional: Negative for fever.  Gastrointestinal: Positive for nausea, abdominal pain and diarrhea. Negative for vomiting.  Musculoskeletal: Positive for back pain.    Physical Exam  SSE WNL. Wet prep, gc/cl and GBS collected  SVE appropriate for a grandmultip  1/thick/high   Blood pressure 130/70, pulse 97, temperature 98.4 F (36.9 C), temperature source Oral, resp. rate 18, height 5' 5.5" (1.664 m), weight 299 lb (135.626 kg), last menstrual period 10/21/2013, not currently breastfeeding.  Physical Exam  Constitutional: She is oriented to person, place, and time. She appears well-developed and well-nourished.  Neck: Normal range of motion.  GI: Soft. She exhibits no distension. There is no tenderness.  Musculoskeletal: Normal range of motion. She exhibits no edema.  Neurological: She is alert and oriented to person, place, and time.  Skin: Skin is warm and dry.    ED Course  Assessment: IUP at 21.5 weeks O negative White creamy discharge noted   Plan: UA, Wet prep, GC/CL and GBS  sent Ibuprofen for cramping    Chukwuemeka Artola CNM, MSN 04/10/2014 5:52 PM   Addendum 1815 Results for orders placed during the hospital encounter of 04/10/14 (from the past 24 hour(s))  URINALYSIS, ROUTINE W REFLEX MICROSCOPIC     Status: Abnormal   Collection Time    04/10/14  4:50 PM      Result Value Ref Range   Color, Urine YELLOW  YELLOW   APPearance HAZY (*) CLEAR   Specific Gravity, Urine >1.030 (*) 1.005 - 1.030   pH 6.0  5.0 - 8.0   Glucose, UA NEGATIVE  NEGATIVE mg/dL   Hgb urine dipstick NEGATIVE  NEGATIVE   Bilirubin Urine NEGATIVE  NEGATIVE   Ketones, ur 15 (*) NEGATIVE mg/dL   Protein, ur NEGATIVE  NEGATIVE mg/dL   Urobilinogen, UA 0.2  0.0 - 1.0 mg/dL   Nitrite NEGATIVE  NEGATIVE   Leukocytes,  UA NEGATIVE  NEGATIVE  WET PREP, GENITAL     Status: Abnormal   Collection Time    04/10/14  5:37 PM      Result Value Ref Range   Yeast Wet Prep HPF POC NONE SEEN  NONE SEEN   Trich, Wet Prep NONE SEEN  NONE SEEN   Clue Cells Wet Prep HPF POC MODERATE (*) NONE SEEN   WBC, Wet Prep HPF POC MANY (*) NONE SEEN    A/P Likely muscloskeletal pain of pregnancy.  No ctx noted, mild dehydration.   BV on wetprep. Received Ibuprofen at 1800  Dx to home Keep ROB on 04/26/14 Rx Flagyl for BV Ibuprofen 600mg  po q6 x 24hours Hydrate  S/S of PTL reviewed with pt  Melesio Madara, CNM, MSN

## 2014-04-10 NOTE — MAU Note (Addendum)
Lower abd and stomach was hurting for about 3 days. Last night couldn't sleep, had diarrhea last night and this morning.  Continued to contract today..Marland Kitchen

## 2014-04-11 LAB — GC/CHLAMYDIA PROBE AMP
CT PROBE, AMP APTIMA: NEGATIVE
GC PROBE AMP APTIMA: NEGATIVE

## 2014-04-12 LAB — CULTURE, BETA STREP (GROUP B ONLY)

## 2014-04-23 IMAGING — CR DG CHEST 2V
2 series · 2 of 2 positions shown · non-contrast
Comparison: Chest radiograph performed 05/09/2011

CLINICAL DATA: Chest pain and shortness of breath.

CHEST - 2 VIEW

[view not recorded (1 of 2)]
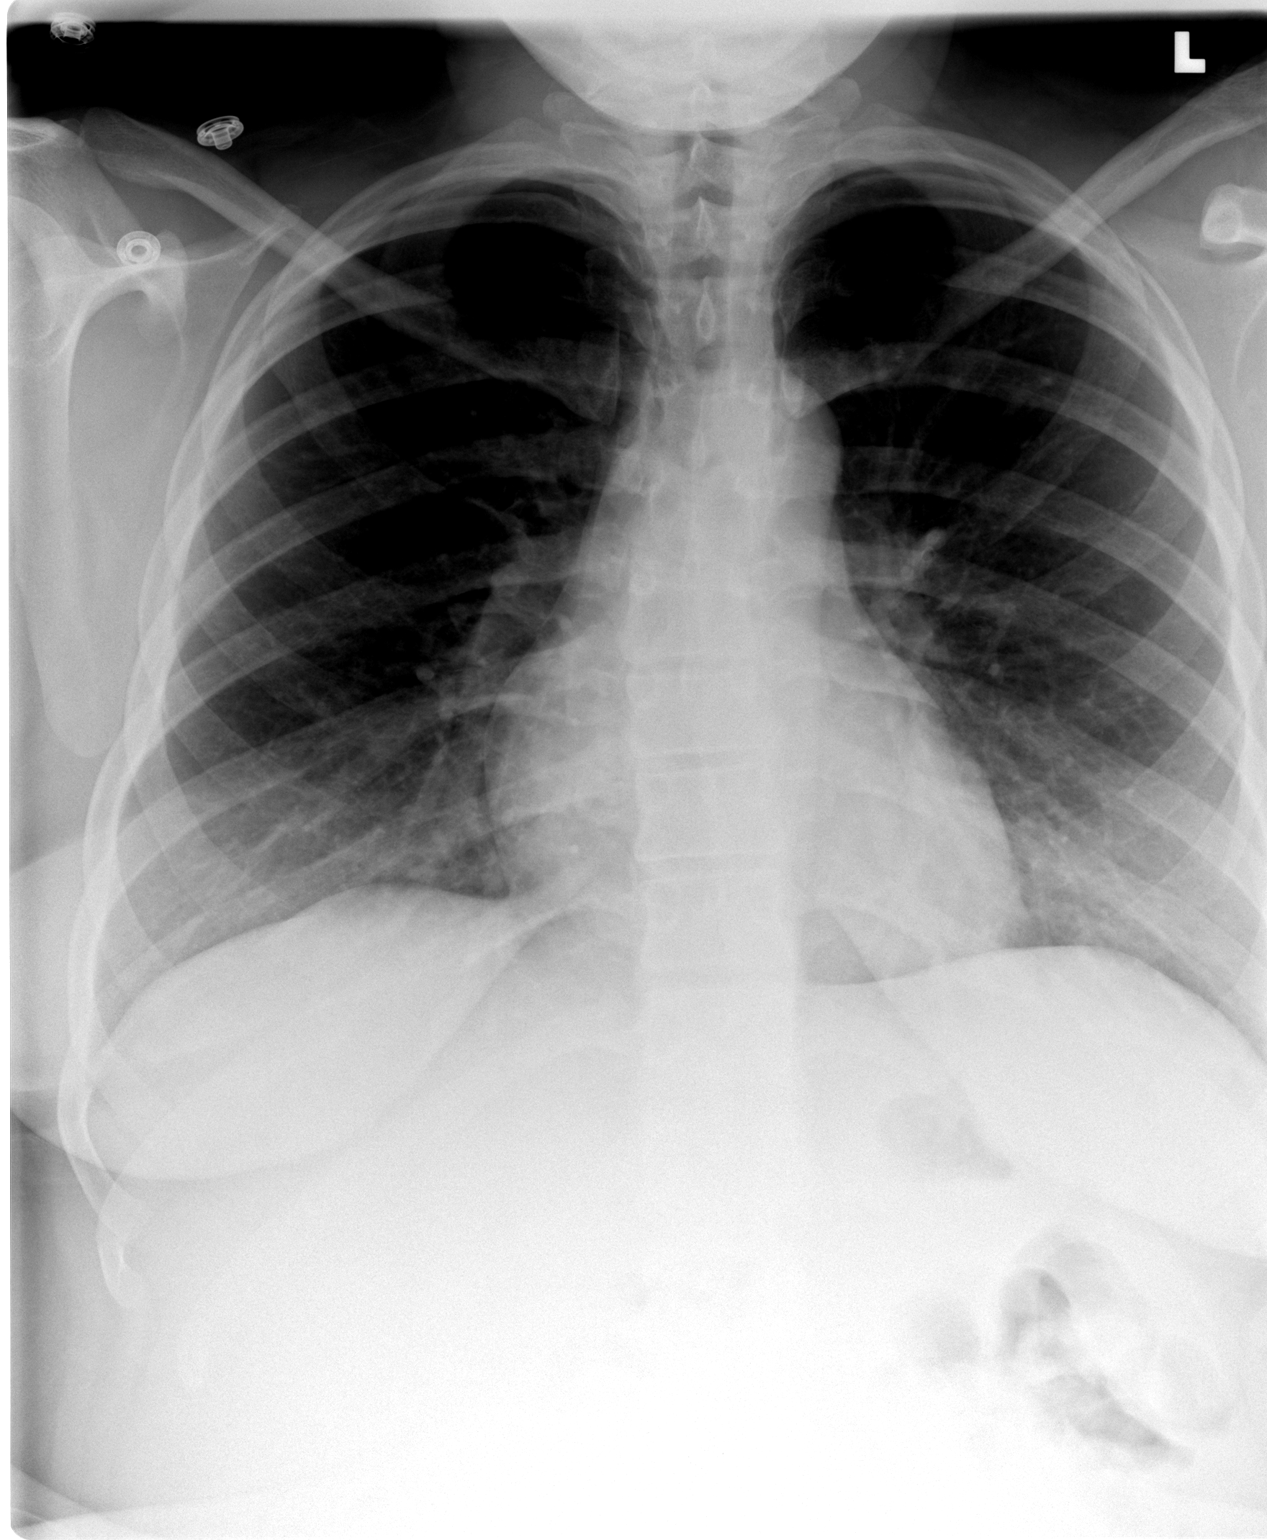

[view not recorded (2 of 2)]
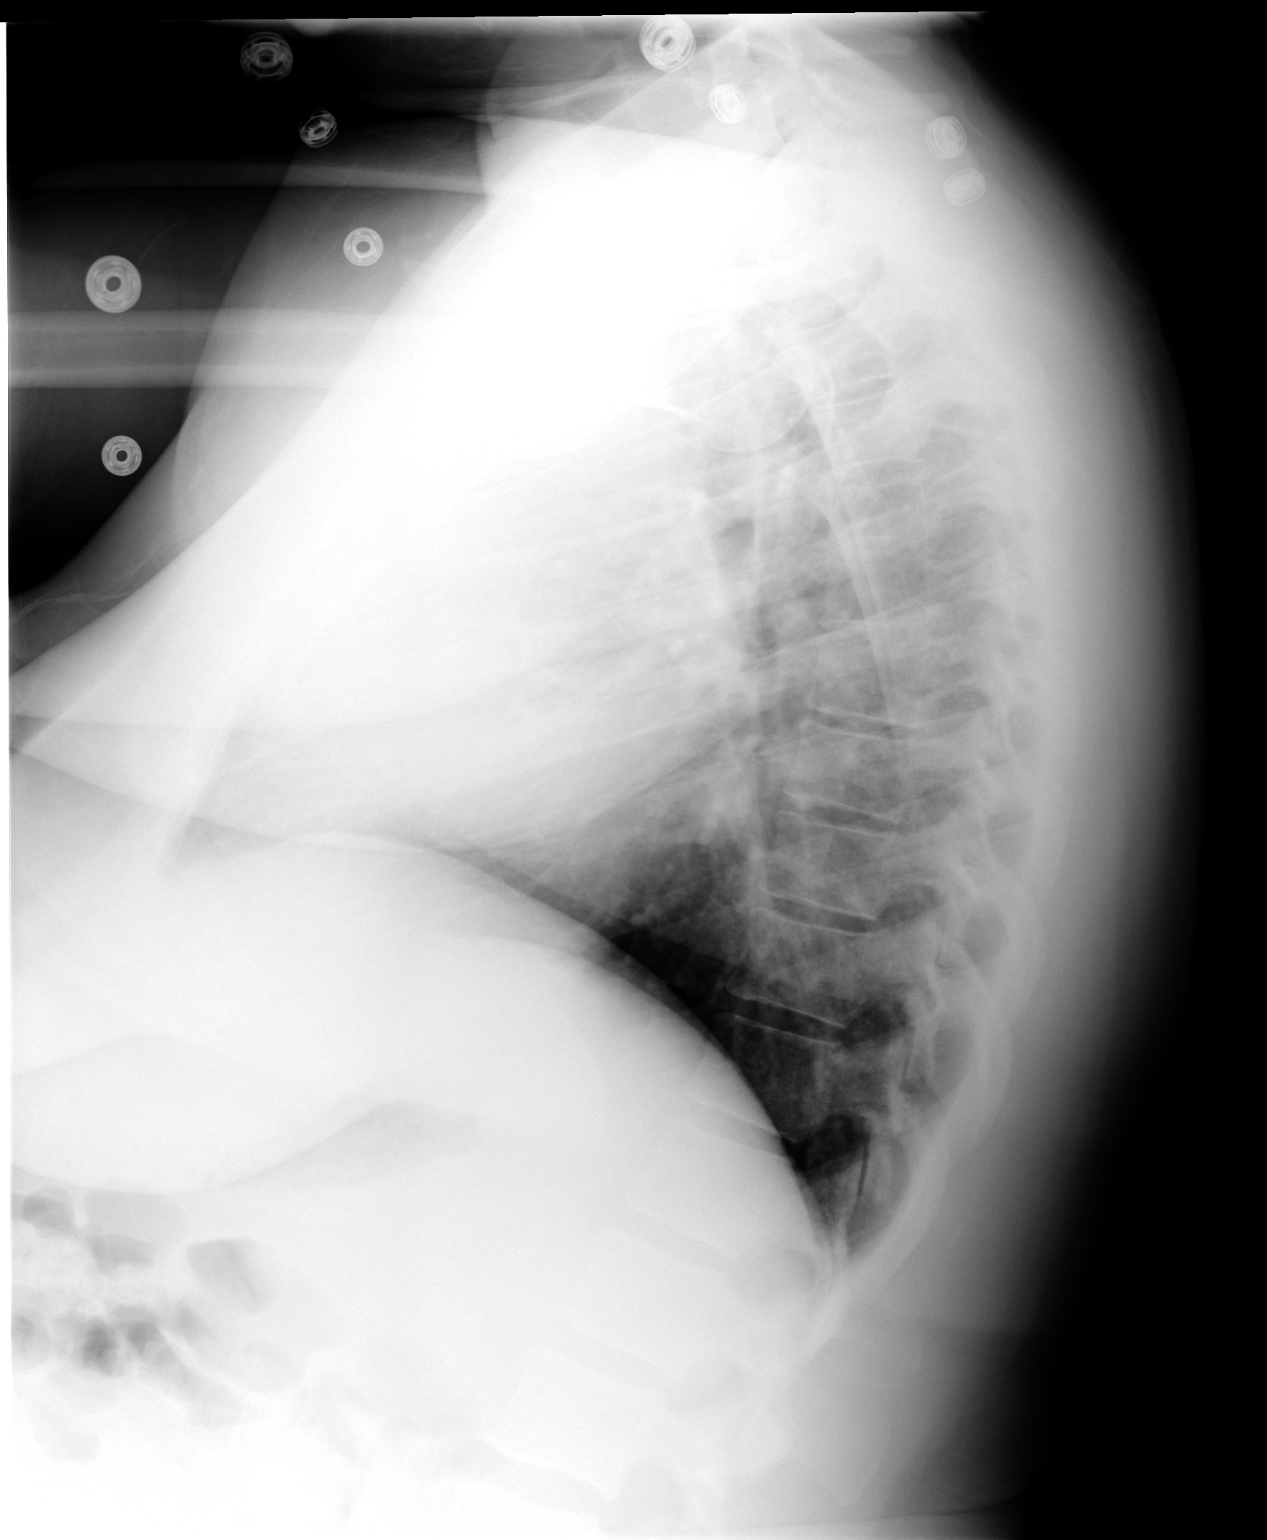

[2 of 2 positions shown; findings below may reference images not displayed]

FINDINGS: The lungs are well-aerated and clear.  There is no
evidence of focal opacification, pleural effusion or pneumothorax.
The left costophrenic angle is incompletely imaged on this study.

The heart is normal in size; the mediastinal contour is within
normal limits.  No acute osseous abnormalities are seen.
IMPRESSION: No acute cardiopulmonary process seen.

## 2014-04-29 LAB — OB RESULTS CONSOLE GC/CHLAMYDIA
Chlamydia: NEGATIVE
GC PROBE AMP, GENITAL: NEGATIVE

## 2014-06-12 ENCOUNTER — Other Ambulatory Visit: Payer: Self-pay | Admitting: Obstetrics and Gynecology

## 2014-06-12 DIAGNOSIS — O26899 Other specified pregnancy related conditions, unspecified trimester: Secondary | ICD-10-CM

## 2014-06-19 ENCOUNTER — Ambulatory Visit (HOSPITAL_COMMUNITY): Payer: Medicaid Other

## 2014-06-22 ENCOUNTER — Ambulatory Visit (HOSPITAL_COMMUNITY): Payer: Medicaid Other

## 2014-07-23 LAB — OB RESULTS CONSOLE GBS: GBS: POSITIVE

## 2014-08-04 ENCOUNTER — Inpatient Hospital Stay (HOSPITAL_COMMUNITY)
Admission: AD | Admit: 2014-08-04 | Discharge: 2014-08-04 | Disposition: A | Discharge status: Home / Self Care | Payer: Medicaid Other | Source: Ambulatory Visit | Attending: Obstetrics and Gynecology | Admitting: Obstetrics and Gynecology

## 2014-08-04 ENCOUNTER — Encounter (HOSPITAL_COMMUNITY): Payer: Self-pay | Admitting: *Deleted

## 2014-08-04 DIAGNOSIS — R011 Cardiac murmur, unspecified: Secondary | ICD-10-CM | POA: Insufficient documentation

## 2014-08-04 DIAGNOSIS — R1110 Vomiting, unspecified: Secondary | ICD-10-CM | POA: Insufficient documentation

## 2014-08-04 DIAGNOSIS — Z22330 Carrier of Group B streptococcus: Secondary | ICD-10-CM | POA: Insufficient documentation

## 2014-08-04 DIAGNOSIS — J029 Acute pharyngitis, unspecified: Secondary | ICD-10-CM | POA: Insufficient documentation

## 2014-08-04 DIAGNOSIS — R509 Fever, unspecified: Secondary | ICD-10-CM | POA: Diagnosis present

## 2014-08-04 DIAGNOSIS — R51 Headache: Secondary | ICD-10-CM | POA: Insufficient documentation

## 2014-08-04 DIAGNOSIS — Z87891 Personal history of nicotine dependence: Secondary | ICD-10-CM | POA: Insufficient documentation

## 2014-08-04 DIAGNOSIS — Z6841 Body Mass Index (BMI) 40.0 and over, adult: Secondary | ICD-10-CM | POA: Insufficient documentation

## 2014-08-04 DIAGNOSIS — Z3A38 38 weeks gestation of pregnancy: Secondary | ICD-10-CM | POA: Insufficient documentation

## 2014-08-04 DIAGNOSIS — R6889 Other general symptoms and signs: Secondary | ICD-10-CM

## 2014-08-04 DIAGNOSIS — O99820 Streptococcus B carrier state complicating pregnancy: Secondary | ICD-10-CM

## 2014-08-04 DIAGNOSIS — N858 Other specified noninflammatory disorders of uterus: Secondary | ICD-10-CM | POA: Diagnosis not present

## 2014-08-04 DIAGNOSIS — R05 Cough: Secondary | ICD-10-CM | POA: Diagnosis not present

## 2014-08-04 LAB — URINE MICROSCOPIC-ADD ON

## 2014-08-04 LAB — CBC WITH DIFFERENTIAL/PLATELET
Basophils Absolute: 0 10*3/uL (ref 0.0–0.1)
Basophils Relative: 0 % (ref 0–1)
Eosinophils Absolute: 0.1 10*3/uL (ref 0.0–0.7)
Eosinophils Relative: 1 % (ref 0–5)
HCT: 26.7 % — ABNORMAL LOW (ref 36.0–46.0)
Hemoglobin: 8.7 g/dL — ABNORMAL LOW (ref 12.0–15.0)
LYMPHS PCT: 11 % — AB (ref 12–46)
Lymphs Abs: 1.1 10*3/uL (ref 0.7–4.0)
MCH: 24.6 pg — ABNORMAL LOW (ref 26.0–34.0)
MCHC: 32.6 g/dL (ref 30.0–36.0)
MCV: 75.6 fL — ABNORMAL LOW (ref 78.0–100.0)
Monocytes Absolute: 0.8 10*3/uL (ref 0.1–1.0)
Monocytes Relative: 8 % (ref 3–12)
Neutro Abs: 8.2 10*3/uL — ABNORMAL HIGH (ref 1.7–7.7)
Neutrophils Relative %: 80 % — ABNORMAL HIGH (ref 43–77)
PLATELETS: 214 10*3/uL (ref 150–400)
RBC: 3.53 MIL/uL — ABNORMAL LOW (ref 3.87–5.11)
RDW: 16.4 % — AB (ref 11.5–15.5)
WBC: 10.1 10*3/uL (ref 4.0–10.5)

## 2014-08-04 LAB — COMPREHENSIVE METABOLIC PANEL
ALK PHOS: 139 U/L — AB (ref 39–117)
ALT: 10 U/L (ref 0–35)
AST: 15 U/L (ref 0–37)
Albumin: 2.6 g/dL — ABNORMAL LOW (ref 3.5–5.2)
Anion gap: 13 (ref 5–15)
BUN: 5 mg/dL — ABNORMAL LOW (ref 6–23)
CO2: 21 mEq/L (ref 19–32)
Calcium: 8.7 mg/dL (ref 8.4–10.5)
Chloride: 102 mEq/L (ref 96–112)
Creatinine, Ser: 0.75 mg/dL (ref 0.50–1.10)
GFR calc non Af Amer: >90 mL/min (ref >90)
GLUCOSE: 103 mg/dL — AB (ref 70–99)
POTASSIUM: 3.7 meq/L (ref 3.7–5.3)
Sodium: 136 mEq/L — ABNORMAL LOW (ref 137–147)
Total Bilirubin: 0.3 mg/dL (ref 0.3–1.2)
Total Protein: 6.5 g/dL (ref 6.0–8.3)

## 2014-08-04 LAB — URINALYSIS, ROUTINE W REFLEX MICROSCOPIC
BILIRUBIN URINE: NEGATIVE
Glucose, UA: NEGATIVE mg/dL
HGB URINE DIPSTICK: NEGATIVE
KETONES UR: NEGATIVE mg/dL
Nitrite: NEGATIVE
Protein, ur: NEGATIVE mg/dL
SPECIFIC GRAVITY, URINE: 1.015 (ref 1.005–1.030)
UROBILINOGEN UA: 0.2 mg/dL (ref 0.0–1.0)
pH: 7 (ref 5.0–8.0)

## 2014-08-04 MED ORDER — ONDANSETRON 4 MG PO TBDP: 4.0000 mg | ORAL_TABLET | Freq: Three times a day (TID) | ORAL | Status: DC | PRN

## 2014-08-04 MED ORDER — ONDANSETRON HCL 4 MG/2ML IJ SOLN
4.0000 mg | Freq: Once | INTRAMUSCULAR | Status: AC
Start: 2014-08-04 — End: 2014-08-04
  Administered 2014-08-04: 4 mg via INTRAVENOUS
  Filled 2014-08-04: qty 2

## 2014-08-04 MED ORDER — LACTATED RINGERS IV BOLUS (SEPSIS): 1000.0000 mL | Freq: Once | INTRAVENOUS | Status: DC

## 2014-08-04 MED ORDER — ACETAMINOPHEN 325 MG PO TABS
975.0000 mg | ORAL_TABLET | Freq: Once | ORAL | Status: AC
  Administered 2014-08-04: 975 mg via ORAL
  Filled 2014-08-04: qty 3

## 2014-08-04 NOTE — MAU Provider Note (Signed)
History  28 yo [id number] @ 38.2 wks presents to MAU unannounced w/ acute onset of feeling hot w/ chills, sore throat, headache, vomiting and loose stool x 1 today. Reports cough. Denies CP, nasal congestion, earache, abdominal pain, painful urination or sick contact. Reports active fetus. No VB, LOF or regular ctxs, however having irregular and mild contractions. Denies self-treatment of sxs.  Patient Active Problem List   Diagnosis Date Noted  . Fever and chills 08/06/2014  . Viral syndrome 08/06/2014  . Heart murmur 08/05/2014  . Severe obesity (BMI >= 40) 08/05/2014  . Group B Streptococcus carrier, +RV culture, currently pregnant (07/23/14) 08/05/2014  . Flu-like symptoms 08/05/2014  . Anemia 04/10/2014  . Susceptible to varicella (non-immune), currently pregnant 04/10/2014  . Rh negative state in antepartum period 04/10/2014  . Short interval between pregnancies affecting pregnancy in second trimester, antepartum 04/10/2014  . Dizziness 03/22/2014  . Back pain affecting pregnancy in first trimester 03/22/2014  . Allergic rhinitis, seasonal 01/13/2013    No chief complaint on file.  HPI See above OB History   Grav Para Term Preterm Abortions TAB SAB Ect Mult Living   6 3 3  2  0 2   3      Past Medical History  Diagnosis Date  . Heart murmur   . Abnormal Pap smear     colpo-ok since  . Obese   . Anemia     Past Surgical History  Procedure Laterality Date  . Dilation and curettage of uterus      Family History  Problem Relation Age of Onset  . Hypertension Mother   . Diabetes Mother   . Heart disease Mother   . Heart disease Father   . Other Neg Hx     History  Substance Use Topics  . Smoking status: Former Smoker -- 0.25 packs/day for 3 years    Types: Cigarettes  . Smokeless tobacco: Never Used  . Alcohol Use: No    Allergies: No Known Allergies  No prescriptions prior to admission    ROS Chills Feeling hot Headache Ctxs +FM Sore  throat Physical Exam   Recent Results (from the past 2160 hour(s))  OB RESULTS CONSOLE GBS     Status: None   Collection Time    07/23/14 12:00 AM      Result Value Ref Range   GBS Positive    CULTURE, OB URINE     Status: None   Collection Time    08/04/14  7:12 PM      Result Value Ref Range   Specimen Description URINE, CLEAN CATCH     Special Requests NONE     Culture  Setup Time       Value: 08/05/2014 00:27     Performed at [facility]       Value: >=100,000 COLONIES/ML     Performed at [facility]   Culture       Value: PROTEUS MIRABILIS     Note: NO GROUP B STREP (S.AGALACTIAE) ISOLATED                                                              Culture based screening of vaginal/anorectal swabs at  35 to [redacted] weeks gestation is required  to rule out the carriage of Group B Streptococcus.     Performed at [facility]   Report Status 08/07/2014 FINAL     Organism ID, Bacteria PROTEUS MIRABILIS    URINALYSIS, ROUTINE W REFLEX MICROSCOPIC     Status: Abnormal   Collection Time    08/04/14  7:17 PM      Result Value Ref Range   Color, Urine YELLOW  YELLOW   APPearance CLEAR  CLEAR   Specific Gravity, Urine 1.015  1.005 - 1.030   pH 7.0  5.0 - 8.0   Glucose, UA NEGATIVE  NEGATIVE mg/dL   Hgb urine dipstick NEGATIVE  NEGATIVE   Bilirubin Urine NEGATIVE  NEGATIVE   Ketones, ur NEGATIVE  NEGATIVE mg/dL   Protein, ur NEGATIVE  NEGATIVE mg/dL   Urobilinogen, UA 0.2  0.0 - 1.0 mg/dL   Nitrite NEGATIVE  NEGATIVE   Leukocytes, UA TRACE (*) NEGATIVE  URINE MICROSCOPIC-ADD ON     Status: Abnormal   Collection Time    08/04/14  7:17 PM      Result Value Ref Range   Squamous Epithelial / LPF MANY (*) RARE   WBC, UA 3-6  <3 WBC/hpf   RBC / HPF 0-2  <3 RBC/hpf   Bacteria, UA MANY (*) RARE   Urine-Other MUCOUS PRESENT    INFLUENZA PANEL BY PCR (TYPE A & B, H1N1)     Status: None   Collection Time    08/04/14  7:50 PM      Result Value  Ref Range   Influenza A By PCR NEGATIVE  NEGATIVE   Influenza B By PCR NEGATIVE  NEGATIVE   H1N1 flu by pcr NOT DETECTED  NOT DETECTED   Comment:            The Xpert Flu assay (FDA approved for     nasal aspirates or washes and     nasopharyngeal swab specimens), is     intended as an aid in the diagnosis of     influenza and should not be used as     a sole basis for treatment.     Performed at [facility]  CBC WITH DIFFERENTIAL     Status: Abnormal   Collection Time    08/04/14  8:00 PM      Result Value Ref Range   WBC 10.1  4.0 - 10.5 K/uL   RBC 3.53 (*) 3.87 - 5.11 MIL/uL   Hemoglobin 8.7 (*) 12.0 - 15.0 g/dL   HCT 26.7 (*) 36.0 - 46.0 %   MCV 75.6 (*) 78.0 - 100.0 fL   MCH 24.6 (*) 26.0 - 34.0 pg   MCHC 32.6  30.0 - 36.0 g/dL   RDW 16.4 (*) 11.5 - 15.5 %   Platelets 214  150 - 400 K/uL   Neutrophils Relative % 80 (*) 43 - 77 %   Neutro Abs 8.2 (*) 1.7 - 7.7 K/uL   Lymphocytes Relative 11 (*) 12 - 46 %   Lymphs Abs 1.1  0.7 - 4.0 K/uL   Monocytes Relative 8  3 - 12 %   Monocytes Absolute 0.8  0.1 - 1.0 K/uL   Eosinophils Relative 1  0 - 5 %   Eosinophils Absolute 0.1  0.0 - 0.7 K/uL   Basophils Relative 0  0 - 1 %   Basophils Absolute 0.0  0.0 - 0.1 K/uL  COMPREHENSIVE METABOLIC PANEL     Status: Abnormal   Collection  Time    08/04/14  8:00 PM      Result Value Ref Range   Sodium 136 (*) 137 - 147 mEq/L   Potassium 3.7  3.7 - 5.3 mEq/L   Chloride 102  96 - 112 mEq/L   CO2 21  19 - 32 mEq/L   Glucose, Bld 103 (*) 70 - 99 mg/dL   BUN 5 (*) 6 - 23 mg/dL   Creatinine, Ser 0.75  0.50 - 1.10 mg/dL   Calcium 8.7  8.4 - 10.5 mg/dL   Total Protein 6.5  6.0 - 8.3 g/dL   Albumin 2.6 (*) 3.5 - 5.2 g/dL   AST 15  0 - 37 U/L   ALT 10  0 - 35 U/L   Alkaline Phosphatase 139 (*) 39 - 117 U/L   Total Bilirubin 0.3  0.3 - 1.2 mg/dL   GFR calc non Af Amer >90  >90 mL/min   GFR calc Af Amer >90  >90 mL/min   Comment: (NOTE)     The eGFR has been calculated using the  CKD EPI equation.     This calculation has not been validated in all clinical situations.     eGFR's persistently <90 mL/min signify possible Chronic Kidney     Disease.   Anion gap 13  5 - 15  CULTURE, BLOOD (ROUTINE X 2)     Status: None   Collection Time    08/04/14  8:05 PM      Result Value Ref Range   Specimen Description BLOOD  RIGHT ARM     Special Requests  10 ML BOTH BOTTLES     Culture  Setup Time       Value: 08/05/2014 00:53     Performed at [facility]   Culture       Value:        BLOOD CULTURE RECEIVED NO GROWTH TO DATE CULTURE WILL BE HELD FOR 5 DAYS BEFORE ISSUING A FINAL NEGATIVE REPORT     Performed at [facility]   Report Status PENDING    CULTURE, BLOOD (ROUTINE X 2)     Status: None   Collection Time    08/04/14  8:15 PM      Result Value Ref Range   Specimen Description BLOOD  RIGHT HAND     Special Requests  10 ML AEB, 4 ML     Culture  Setup Time       Value: 08/05/2014 00:53     Performed at [facility]   Culture       Value:        BLOOD CULTURE RECEIVED NO GROWTH TO DATE CULTURE WILL BE HELD FOR 5 DAYS BEFORE ISSUING A FINAL NEGATIVE REPORT     Performed at [facility]   Report Status PENDING    WET PREP, GENITAL     Status: Abnormal   Collection Time    08/05/14 10:10 PM      Result Value Ref Range   Yeast Wet Prep HPF POC NONE SEEN  NONE SEEN   Trich, Wet Prep NONE SEEN  NONE SEEN   Clue Cells Wet Prep HPF POC FEW (*) NONE SEEN   WBC, Wet Prep HPF POC FEW (*) NONE SEEN   Comment: MODERATE BACTERIA SEEN  CBC WITH DIFFERENTIAL     Status: Abnormal   Collection Time    08/06/14  7:40 AM      Result Value Ref Range  WBC 11.5 (*) 4.0 - 10.5 K/uL   RBC 3.45 (*) 3.87 - 5.11 MIL/uL   Hemoglobin 8.3 (*) 12.0 - 15.0 g/dL   HCT 25.7 (*) 36.0 - 46.0 %   MCV 74.5 (*) 78.0 - 100.0 fL   MCH 24.1 (*) 26.0 - 34.0 pg   MCHC 32.3  30.0 - 36.0 g/dL   RDW 16.4 (*) 11.5 - 15.5 %   Platelets 214  150 - 400 K/uL    Neutrophils Relative % 77  43 - 77 %   Neutro Abs 8.8 (*) 1.7 - 7.7 K/uL   Lymphocytes Relative 12  12 - 46 %   Lymphs Abs 1.4  0.7 - 4.0 K/uL   Monocytes Relative 10  3 - 12 %   Monocytes Absolute 1.2 (*) 0.1 - 1.0 K/uL   Eosinophils Relative 1  0 - 5 %   Eosinophils Absolute 0.1  0.0 - 0.7 K/uL   Basophils Relative 0  0 - 1 %   Basophils Absolute 0.0  0.0 - 0.1 K/uL     Blood pressure 135/74, pulse 108, temperature 99.2 F (37.3 C), temperature source Oral, resp. rate 20, height 5' 7"  (1.702 m), weight 291 lb 8 oz (132.224 kg), last menstrual period 10/21/2013, SpO2 99.00%, not currently breastfeeding.  Physical Exam Gen: A&O x 3 HEENT: Atraumatic, clear oropharynx, normal TM, no sinus tenderness to palpation Lungs: CTAB CV: RRR; murmur not audible Abd: soft, gravid, NT Pelvic: Deferred FHRT: BL 170-180 on presentation, decreased to 150 with hydration. Cat 1 at discharge. Moderate variability throughout, +accels, no decels Ctxs: Irregular ED Course  IVFs Tylenol Zofran IV Fever workup Assessment: Suspect viral syndrome. CAT1 FHT. Will defer CXR given lung exam findings at this time.  Plan: IV hydration Labs/cultures Antiemetics   Farrel Gordon CNM, MS 08/04/14, 7:58 PM  ADDENDUM: Observed in MAU. Pt reported feeling better with rest, Zofran and IVFs. Temp 98-99.2, pulse 100s. Labor precautions. Keep f/u office appt on Thursday. Cepacol or saline gargles. Zofran prn. Further advised rest, increased hydration and diet as tolerated. Return sooner to MAU if no improvement in sxs prior to office visit in 48 hrs, i.e. Temp > 100.4 or other worsening sxs as noted above.   Farrel Gordon, CNM 08/05/14, 1:00 AM

## 2014-08-04 NOTE — MAU Note (Signed)
PT  SAYS VOMITING   TODAY.  NAUSEA  YESTERDAY.   HAD LOOSE BM   TODAY.    SAYS SHE FEELS WARM-  BUT NO THERMOMETER  AT HOME.     HAS SORE THROAT.Marland Kitchen.  HAS SLIGHT  COUGH-  NON PRODUCTIVE.    FEELS  CHILLS AND BODY ACHE  TODAY-       DID NOT CALL OFFICE.     GOT FLU SHOT   3 WEEKS AGO.    NOONE ELSE IN  HOME HAD FLU.       IN OFFFICE ON Thursday-   VE 1 CM   2 WEEKS  AGO.     DENIES HSV AND MRSA.   GBS- DONE.

## 2014-08-05 ENCOUNTER — Encounter (HOSPITAL_COMMUNITY): Payer: Self-pay | Admitting: *Deleted

## 2014-08-05 ENCOUNTER — Encounter: Payer: Self-pay | Admitting: *Deleted

## 2014-08-05 ENCOUNTER — Observation Stay (HOSPITAL_COMMUNITY)
Admission: AD | Admit: 2014-08-05 | Discharge: 2014-08-06 | Disposition: A | Discharge status: Home / Self Care | Payer: Medicaid Other | Source: Ambulatory Visit | Attending: Obstetrics and Gynecology | Admitting: Obstetrics and Gynecology

## 2014-08-05 DIAGNOSIS — Z3A38 38 weeks gestation of pregnancy: Secondary | ICD-10-CM | POA: Insufficient documentation

## 2014-08-05 DIAGNOSIS — B348 Other viral infections of unspecified site: Secondary | ICD-10-CM | POA: Diagnosis not present

## 2014-08-05 DIAGNOSIS — O99820 Streptococcus B carrier state complicating pregnancy: Secondary | ICD-10-CM

## 2014-08-05 DIAGNOSIS — R000 Tachycardia, unspecified: Secondary | ICD-10-CM | POA: Diagnosis not present

## 2014-08-05 DIAGNOSIS — Z87891 Personal history of nicotine dependence: Secondary | ICD-10-CM | POA: Insufficient documentation

## 2014-08-05 DIAGNOSIS — R509 Fever, unspecified: Secondary | ICD-10-CM | POA: Diagnosis present

## 2014-08-05 DIAGNOSIS — B349 Viral infection, unspecified: Secondary | ICD-10-CM

## 2014-08-05 DIAGNOSIS — R6889 Other general symptoms and signs: Secondary | ICD-10-CM

## 2014-08-05 DIAGNOSIS — O98513 Other viral diseases complicating pregnancy, third trimester: Secondary | ICD-10-CM | POA: Diagnosis not present

## 2014-08-05 DIAGNOSIS — R011 Cardiac murmur, unspecified: Secondary | ICD-10-CM

## 2014-08-05 LAB — WET PREP, GENITAL
Trich, Wet Prep: NONE SEEN
Yeast Wet Prep HPF POC: NONE SEEN

## 2014-08-05 LAB — INFLUENZA PANEL BY PCR (TYPE A & B)
H1N1 flu by pcr: NOT DETECTED
Influenza A By PCR: NEGATIVE
Influenza B By PCR: NEGATIVE

## 2014-08-05 MED ORDER — SODIUM CHLORIDE 0.9 % IV SOLN
INTRAVENOUS | Status: DC
  Administered 2014-08-06: 05:00:00 via INTRAVENOUS

## 2014-08-05 MED ORDER — LACTATED RINGERS IV SOLN: INTRAVENOUS | Status: DC

## 2014-08-05 MED ORDER — LACTATED RINGERS IV BOLUS (SEPSIS)
1000.0000 mL | Freq: Once | INTRAVENOUS | Status: AC
  Administered 2014-08-05: 1000 mL via INTRAVENOUS

## 2014-08-05 MED ORDER — LACTATED RINGERS IV BOLUS (SEPSIS): 500.0000 mL | Freq: Once | INTRAVENOUS | Status: DC

## 2014-08-05 MED ORDER — CALCIUM CARBONATE ANTACID 500 MG PO CHEW: 2.0000 | CHEWABLE_TABLET | ORAL | Status: DC | PRN

## 2014-08-05 MED ORDER — DEXTROSE 5 % IV SOLN
1.0000 g | Freq: Once | INTRAVENOUS | Status: AC
  Administered 2014-08-05: 1 g via INTRAVENOUS
  Filled 2014-08-05: qty 10

## 2014-08-05 MED ORDER — FERROUS SULFATE 325 (65 FE) MG PO TABS
325.0000 mg | ORAL_TABLET | Freq: Every day | ORAL | Status: DC
  Administered 2014-08-06: 325 mg via ORAL
  Filled 2014-08-05: qty 1

## 2014-08-05 MED ORDER — ACETAMINOPHEN 325 MG PO TABS
650.0000 mg | ORAL_TABLET | ORAL | Status: DC | PRN
  Administered 2014-08-06 (×2): 650 mg via ORAL
  Filled 2014-08-05 (×2): qty 2

## 2014-08-05 MED ORDER — ACETAMINOPHEN 500 MG PO TABS
1000.0000 mg | ORAL_TABLET | Freq: Once | ORAL | Status: AC
  Administered 2014-08-05: 1000 mg via ORAL
  Filled 2014-08-05: qty 2

## 2014-08-05 MED ORDER — ZOLPIDEM TARTRATE 5 MG PO TABS
5.0000 mg | ORAL_TABLET | Freq: Every evening | ORAL | Status: DC | PRN
  Administered 2014-08-06: 5 mg via ORAL
  Filled 2014-08-05: qty 1

## 2014-08-05 MED ORDER — PRENATAL MULTIVITAMIN CH
1.0000 | ORAL_TABLET | Freq: Every day | ORAL | Status: DC
  Administered 2014-08-06: 1 via ORAL
  Filled 2014-08-05: qty 1

## 2014-08-05 MED ORDER — DOCUSATE SODIUM 100 MG PO CAPS
100.0000 mg | ORAL_CAPSULE | Freq: Every day | ORAL | Status: DC
  Administered 2014-08-06: 100 mg via ORAL
  Filled 2014-08-05: qty 1

## 2014-08-05 NOTE — H&P (Signed)
Robin Hodge is a 28 y.o. female, a [id number] at 38.3wks who presents, unannounced, c/o fever, chills, and pelvic pressure. Patient seen for fever complaint yesterday evening and work-up performed. Patient states that fever continues today despite tylenol usage and rest. Patient also reports vaginal pressure and intermittent contractions that started this afternoon. Patient denies LOF, VB, and issues with urination or diarrhea. Patient reports active fetus, despite not eating today.    Patient Active Problem List   Diagnosis Date Noted  . Flu-like symptoms 08/04/2014  . Anemia 04/10/2014  . Susceptible to varicella (non-immune), currently pregnant 04/10/2014  . Rh negative state in antepartum period 04/10/2014  . Short interval between pregnancies affecting pregnancy in second trimester, antepartum 04/10/2014  . Dizziness 03/22/2014  . Back pain affecting pregnancy in first trimester 03/22/2014  . Allergic rhinitis, seasonal 01/13/2013  . Normal delivery 11/25/2012    History of present pregnancy: Patient entered care at 20.1 weeks.   EDC of 08/16/2014 was established by LMP of 11/09/2013.   Anatomy scan:  20.1 weeks, with normal findings and an anterior placenta.   Additional US evaluations:   -31.4wks EFW 4lbs 2oz, 50%, nl fluid -32.4wks BPP TODAY 8/8. CEPHALIC -33.4wks  BPP 8/8. AI 21.3cm -34.5wks BPP: 8/8. AFI 22. -36.2wks BPP 8/8. AFI 25.4cm (95%).  -37.4wks BPP 8/8 vertex, normal fluid, Significant prenatal events:   Patient c/o vaginal itching,  + yeast. Pt worried about exposure STD--all test negative. C/o back pain and SOB Last evaluation:  08/04/2014 by K. Mayford KnifeWilliams, CNM  OB History   Grav Para Term Preterm Abortions TAB SAB Ect Mult Living   6 3 3  2  0 2   3     Past Medical History  Diagnosis Date  . Heart murmur   . Abnormal Pap smear     colpo-ok since  . Obese   . Anemia    Past Surgical History  Procedure Laterality Date  . Dilation and curettage of uterus      Family History: family history includes Diabetes in her mother; Heart disease in her father and mother; Hypertension in her mother. There is no history of Other. Social History:  reports that she has quit smoking. Her smoking use included Cigarettes. She has a .75 pack-year smoking history. She has never used smokeless tobacco. She reports that she does not drink alcohol or use illicit drugs.   Prenatal Transfer Tool  Maternal Diabetes: No Genetic Screening: Declined Maternal Ultrasounds/Referrals: Normal Fetal Ultrasounds or other Referrals:  None Maternal Substance Abuse:  No Significant Maternal Medications:  Meds include: Other: Tylenol EX, PNV, Ferrous Sulfate, Percocet Significant Maternal Lab Results: Lab values include: Group B Strep positive, Rh negative    ROS:  See HPI Above  No Known Allergies   Dilation: 1 Effacement (%): 50 Station:  (-4) Exam by:: J Kamala Kolton CNM Blood pressure 136/74, pulse 133, temperature 100.4 F (38 C), temperature source Axillary, resp. rate 20, height 5\' 7"  (1.702 m), weight 288 lb 3.2 oz (130.727 kg), last menstrual period 10/21/2013, not currently breastfeeding.  Physical Exam  Constitutional: She is oriented to person, place, and time. She appears well-developed and well-nourished.  Cardiovascular: Regular rhythm and normal heart sounds. Tachycardia present.  Respiratory: Effort normal and breath sounds normal.  GI: Bowel sounds are normal. There is no tenderness.  Appears gravid--fundal height AGA. Soft, NT, no ctx palpated Genitourinary: Wet prep collected  Neurological: She is alert and oriented to person, place, and time.  Skin: Skin is  warm and dry. She is not diaphoretic.  Psychiatric: She has a normal mood and affect. Her behavior is normal.   FHR: 145 bpm, Mod Var, -Decels, +Accels Toco: No ctx graphed or palpated    Prenatal labs: ABO, Rh:  O Negative Antibody:  Negative Rubella:   Immune RPR:   Negative HBsAg:    Negative HIV:   Negative GBS:  Positive Sickle cell/Hgb electrophoresis:  Normal AA Pap:  Normal GC:  Negative Chlamydia:  Negatie Genetic screenings:  Declined Glucola:  Normal Other:  HSV 1-Positive, HSV 2-Negative, Hep C Negative    Assessment IUP at 38.3wks Cat I FT Fever & Chills Tachycardia  Plan: Admit for observation per Dr. Katharine LookJ. Ozan Standard Antenatal Orders Rocephin 1g via IV Will continue to observe  [facility]EMLY, Marsela Kuan LYNN CNM, MSN 08/05/2014, 10:28 PM

## 2014-08-05 NOTE — MAU Note (Signed)
Pt to have IV started with L/R bolus and Tylenol po. Gerrit HeckJessica Emly CNM to come assess patient.

## 2014-08-05 NOTE — MAU Note (Signed)
IV changed to NS for Rocephin IV to be infused in L &D.

## 2014-08-05 NOTE — MAU Provider Note (Signed)
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 [id number] at 38.3wks who presents, unannounced, c/o fever.  Patient seen for same complaint yesterday evening and fever work-up performed.  Patient states that fever continues today despite tylenol usage and rest.  Patient also reports vaginal pressure and intermittent contractions.  Patient denies LOF, VB, and issues with urination or diarrhea.  Patient reports active fetus, despite not eating today.     Patient Active Problem List   Diagnosis Date Noted  . Flu-like symptoms 08/04/2014  . Anemia 04/10/2014  . Susceptible to varicella (non-immune), currently pregnant 04/10/2014  . Rh negative state in antepartum period 04/10/2014  . Short interval between pregnancies affecting pregnancy in second trimester, antepartum 04/10/2014  . Dizziness 03/22/2014  . Back pain affecting pregnancy in first trimester 03/22/2014  . Allergic rhinitis, seasonal 01/13/2013  . Normal delivery 11/25/2012    No chief complaint on file.  HPI  OB History   Grav Para Term Preterm Abortions TAB SAB Ect Mult Living   6 3 3  2  0 2   3      Past Medical History  Diagnosis Date  . Heart murmur   . Abnormal Pap smear     colpo-ok since  . Obese   . Anemia     Past Surgical History  Procedure Laterality Date  . Dilation and curettage of uterus      Family History  Problem Relation Age of Onset  . Hypertension Mother   . Diabetes Mother   . Heart disease Mother   . Heart disease Father   . Other Neg Hx     History  Substance Use Topics  . Smoking status: Former Smoker -- 0.25 packs/day for 3 years    Types: Cigarettes  . Smokeless tobacco: Never Used  . Alcohol Use: No    Allergies: No Known Allergies  Prescriptions prior to admission  Medication Sig Dispense Refill  . acetaminophen (TYLENOL) 500 MG tablet Take 1,000 mg by mouth every 6 (six) hours as needed for mild pain.      . ferrous sulfate 325 (65 FE) MG tablet Take 325 mg by mouth daily.       . ondansetron (ZOFRAN-ODT) 4 MG disintegrating tablet Take 1 tablet (4 mg total) by mouth every 8 (eight) hours as needed for nausea or vomiting.  30 tablet  0  . Phenylephrine-DM-GG-APAP (TYLENOL COLD MULTI-SYMPTOM) 5-10-200-325 MG TABS Take 2 tablets by mouth once.      . Prenatal Vit-Fe Fumarate-FA (PRENATAL MULTIVITAMIN) TABS Take 1 tablet by mouth daily.        ROS  See HPI Above Physical Exam   Blood pressure 136/74, pulse 133, temperature 100.4 F (38 C), temperature source Axillary, resp. rate 20, height 5\' 7"  (1.702 m), weight 288 lb 3.2 oz (130.727 kg), last menstrual period 10/21/2013, not currently breastfeeding.  Physical Exam  Constitutional: She is oriented to person, place, and time. She appears well-developed and well-nourished.  Cardiovascular: Regular rhythm and normal heart sounds.  Tachycardia present.   Respiratory: Effort normal and breath sounds normal.  GI: Bowel sounds are normal. There is no tenderness.  Appears gravid--fundal height AGA.  Soft, NT, no ctx palpated   Genitourinary:  Wet prep collected  Neurological: She is alert and oriented to person, place, and time.  Skin: Skin is warm and dry. She is not diaphoretic.  Psychiatric: She has a normal mood and affect. Her behavior is normal.   FHR: 155 bpm, Mod  Var, -Decels, +Accels Toco: No ctx graphed or palpated  ED Course  Assessment: IUP at 38.3wks Cat I FT Fever & Chills Tachycardia  Plan: -Tylenol EX now -Start IV with bolus -Cultures Pending -Wet Prep -Will consult with Dr. Katharine LookJ. Ozan  Follow Up (Time) -Admit for observation per consult with Dr. Rosalio MacadamiaJ. Ozan  Robin Hodge CNM, MSN 08/05/2014 9:17 PM

## 2014-08-06 ENCOUNTER — Encounter (HOSPITAL_COMMUNITY): Payer: Self-pay | Admitting: *Deleted

## 2014-08-06 DIAGNOSIS — B349 Viral infection, unspecified: Secondary | ICD-10-CM

## 2014-08-06 DIAGNOSIS — R509 Fever, unspecified: Secondary | ICD-10-CM | POA: Diagnosis present

## 2014-08-06 DIAGNOSIS — O98513 Other viral diseases complicating pregnancy, third trimester: Secondary | ICD-10-CM | POA: Diagnosis not present

## 2014-08-06 LAB — CBC WITH DIFFERENTIAL/PLATELET
Basophils Absolute: 0 10*3/uL (ref 0.0–0.1)
Basophils Relative: 0 % (ref 0–1)
Eosinophils Absolute: 0.1 10*3/uL (ref 0.0–0.7)
Eosinophils Relative: 1 % (ref 0–5)
HCT: 25.7 % — ABNORMAL LOW (ref 36.0–46.0)
Hemoglobin: 8.3 g/dL — ABNORMAL LOW (ref 12.0–15.0)
LYMPHS PCT: 12 % (ref 12–46)
Lymphs Abs: 1.4 10*3/uL (ref 0.7–4.0)
MCH: 24.1 pg — ABNORMAL LOW (ref 26.0–34.0)
MCHC: 32.3 g/dL (ref 30.0–36.0)
MCV: 74.5 fL — ABNORMAL LOW (ref 78.0–100.0)
MONO ABS: 1.2 10*3/uL — AB (ref 0.1–1.0)
Monocytes Relative: 10 % (ref 3–12)
NEUTROS ABS: 8.8 10*3/uL — AB (ref 1.7–7.7)
Neutrophils Relative %: 77 % (ref 43–77)
Platelets: 214 10*3/uL (ref 150–400)
RBC: 3.45 MIL/uL — AB (ref 3.87–5.11)
RDW: 16.4 % — ABNORMAL HIGH (ref 11.5–15.5)
WBC: 11.5 10*3/uL — ABNORMAL HIGH (ref 4.0–10.5)

## 2014-08-06 NOTE — Progress Notes (Signed)
Pt doing well. FHM removed by CNM. Pt ordering regular breakfast. Pt denies any needs at this time.

## 2014-08-06 NOTE — Discharge Instructions (Signed)
Take 2 extra strength Tylenol every 6 hours for 24 hours, then as needed. Call for any fever over 100.4 despite Tylenol. Push fluids Increase rest Office will call to schedule next week's appt. Call for any questions or concerns.  Fever, Adult A fever is a temperature of 100.4 F (38 C) or above.  HOME CARE  Take fever medicine as told by your doctor. Do not  take aspirin for fever if you are younger than 28 years of age.  If you are given antibiotic medicine, take it as told. Finish the medicine even if you start to feel better.  Rest.  Drink enough fluids to keep your pee (urine) clear or pale yellow. Do not drink alcohol.  Take a bath or shower with room temperature water. Do not use ice water or alcohol sponge baths.  Wear lightweight, loose clothes. GET HELP RIGHT AWAY IF:   You are short of breath or have trouble breathing.  You are very weak.  You are dizzy or you pass out (faint).  You are very thirsty or are making little or no urine.  You have new pain.  You throw up (vomit) or have watery poop (diarrhea).  You keep throwing up or having watery poop for more than 1 to 2 days.  You have a stiff neck or light bothers your eyes.  You have a skin rash.  You have a fever or problems (symptoms) that last for more than 2 to 3 days.  You have a fever and your problems quickly get worse.  You keep throwing up the fluids you drink.  You do not feel better after 3 days.  You have new problems. MAKE SURE YOU:   Understand these instructions.  Will watch your condition.  Will get help right away if you are not doing well or get worse. Document Released: 07/04/2008 Document Revised: 12/18/2011 Document Reviewed: 07/27/2011 [facility] Patient Information 2015 [employer] [place], [place]. This information is not intended to replace advice given to you by your health care provider. Make sure you discuss any questions you have with your health care provider.

## 2014-08-06 NOTE — Plan of Care (Signed)
Problem: Phase I Progression Outcomes Goal: Discharge home if all goals are met Outcome: Completed/Met Date Met:  08/06/14 Pt given discharge instructions, discussed rest, increase fluid intake, OTC meds as directed on discharge sheet for cold/pain/rest. Will follow up with provider if pt has increased fever, pain or labor.

## 2014-08-06 NOTE — Progress Notes (Signed)
Pt moving, retaken

## 2014-08-06 NOTE — Discharge Summary (Signed)
Physician Discharge Summary  Patient ID: Robin Hodge H Holycross MRN: [id number] DOB/AGE: Apr 23, 1986 28 y.o.  Admit date: 08/05/2014 Discharge date: 08/06/2014  Admission Diagnoses:  IUP at 38 4/7 weeks, fever  Discharge Diagnoses:  Active Problems:   Fever and chills   Viral syndrome   Discharged Condition: stable  Hospital Course: Presented to MAU on the evening of 08/05/14 with fever, chills, and pelvic pressure.  Her son had similar sx over the last week.  Had been seen 10/27 for the same sx.  Admitted for observation, Rocephin IV, Tylenol--temp max 100.4 during hospitalization.  FHR remained Category 1, with irregular contractions.  WBC count was stable and WNL. Blood cultures done, pending at the time of d/c.  Influenza panel WNL.  Urine culture from 10/27 showed 100K Proteus mirabilis, with sensitivities pending at the time of d/c today.  Patient feeling well on 10/29, tolerating regular diet, with temp 98.1 and 98.5.  Dr. Stefano GaulStringer was consulted, and plan made to d/c patient home.  Office will f/u with patient to schedule ROB visit for next week.  Patient to take Tylenol ATC x 24 hours, then prn.  Consults: None  Significant Diagnostic Studies: labs:  Results for orders placed during the hospital encounter of 08/05/14 (from the past 24 hour(s))  WET PREP, GENITAL     Status: Abnormal   Collection Time    08/05/14 10:10 PM      Result Value Ref Range   Yeast Wet Prep HPF POC NONE SEEN  NONE SEEN   Trich, Wet Prep NONE SEEN  NONE SEEN   Clue Cells Wet Prep HPF POC FEW (*) NONE SEEN   WBC, Wet Prep HPF POC FEW (*) NONE SEEN  CBC WITH DIFFERENTIAL     Status: Abnormal   Collection Time    08/06/14  7:40 AM      Result Value Ref Range   WBC 11.5 (*) 4.0 - 10.5 K/uL   RBC 3.45 (*) 3.87 - 5.11 MIL/uL   Hemoglobin 8.3 (*) 12.0 - 15.0 g/dL   HCT 40.925.7 (*) 81.136.0 - 91.446.0 %   MCV 74.5 (*) 78.0 - 100.0 fL   MCH 24.1 (*) 26.0 - 34.0 pg   MCHC 32.3  30.0 - 36.0 g/dL   RDW 78.216.4 (*) 95.611.5 - 21.315.5 %    Platelets 214  150 - 400 K/uL   Neutrophils Relative % 77  43 - 77 %   Neutro Abs 8.8 (*) 1.7 - 7.7 K/uL   Lymphocytes Relative 12  12 - 46 %   Lymphs Abs 1.4  0.7 - 4.0 K/uL   Monocytes Relative 10  3 - 12 %   Monocytes Absolute 1.2 (*) 0.1 - 1.0 K/uL   Eosinophils Relative 1  0 - 5 %   Eosinophils Absolute 0.1  0.0 - 0.7 K/uL   Basophils Relative 0  0 - 1 %   Basophils Absolute 0.0  0.0 - 0.1 K/uL     Treatments: IV hydration and antibiotics: ceftriaxone  Discharge Exam: Blood pressure 136/68, pulse 108, temperature 98.5 F (36.9 C), temperature source Oral, resp. rate 18, height 5\' 7"  (1.702 m), weight 285 lb (129.275 kg), last menstrual period 10/21/2013, not currently breastfeeding. General appearance: alert Back: symmetric, no curvature. ROM normal. No CVA tenderness. Cardio: regular rate and rhythm, S1, S2 normal, no murmur, click, rub or gallop Pelvic: cervix normal in appearance, external genitalia normal, no adnexal masses or tenderness, no cervical motion tenderness, rectovaginal septum normal, uterus normal  size, shape, and consistency and vagina normal without discharge, cervix 1 cm, 50%, vtx, high.  Disposition: 01-Home or Self Care     Medication List         acetaminophen 500 MG tablet  Commonly known as:  TYLENOL  Take 1,000 mg by mouth every 6 (six) hours as needed for mild pain.     ferrous sulfate 325 (65 FE) MG tablet  Take 325 mg by mouth daily.     ondansetron 4 MG disintegrating tablet  Commonly known as:  ZOFRAN-ODT  Take 1 tablet (4 mg total) by mouth every 8 (eight) hours as needed for nausea or vomiting.     prenatal multivitamin Tabs tablet  Take 1 tablet by mouth daily.     TYLENOL COLD MULTI-SYMPTOM 5-10-200-325 MG Tabs  Generic drug:  Phenylephrine-DM-GG-APAP  Take 2 tablets by mouth every 6 (six) hours as needed (cold symptoms).       Follow-up Information   [facility] In 1 week. (Call  for any questions or concerns.  Office will call you to schedule next week's appt.)    Specialty:  Obstetrics and Gynecology   Contact information:   [address] [place] [id number] [phone]      Signed: Nigel BridgemanLATHAM, Saki Legore 08/06/2014, 11:43 AM

## 2014-08-07 LAB — CULTURE, OB URINE: Colony Count: >=100000

## 2014-08-10 ENCOUNTER — Encounter (HOSPITAL_COMMUNITY): Payer: Self-pay | Admitting: *Deleted

## 2014-08-11 ENCOUNTER — Encounter (HOSPITAL_COMMUNITY): Payer: Self-pay | Admitting: *Deleted

## 2014-08-11 ENCOUNTER — Inpatient Hospital Stay (HOSPITAL_COMMUNITY)
Admission: AD | Admit: 2014-08-11 | Discharge: 2014-08-13 | DRG: 774 | Disposition: A | Discharge status: Home / Self Care | Payer: Medicaid Other | Source: Ambulatory Visit | Attending: Obstetrics and Gynecology | Admitting: Obstetrics and Gynecology

## 2014-08-11 DIAGNOSIS — O360930 Maternal care for other rhesus isoimmunization, third trimester, not applicable or unspecified: Secondary | ICD-10-CM | POA: Diagnosis present

## 2014-08-11 DIAGNOSIS — O99214 Obesity complicating childbirth: Secondary | ICD-10-CM | POA: Diagnosis present

## 2014-08-11 DIAGNOSIS — O99824 Streptococcus B carrier state complicating childbirth: Secondary | ICD-10-CM | POA: Diagnosis present

## 2014-08-11 DIAGNOSIS — Z3A39 39 weeks gestation of pregnancy: Secondary | ICD-10-CM | POA: Diagnosis present

## 2014-08-11 DIAGNOSIS — Z3483 Encounter for supervision of other normal pregnancy, third trimester: Secondary | ICD-10-CM | POA: Diagnosis present

## 2014-08-11 DIAGNOSIS — O99344 Other mental disorders complicating childbirth: Secondary | ICD-10-CM | POA: Diagnosis present

## 2014-08-11 DIAGNOSIS — F29 Unspecified psychosis not due to a substance or known physiological condition: Secondary | ICD-10-CM | POA: Diagnosis present

## 2014-08-11 DIAGNOSIS — N858 Other specified noninflammatory disorders of uterus: Secondary | ICD-10-CM | POA: Diagnosis present

## 2014-08-11 DIAGNOSIS — O1092 Unspecified pre-existing hypertension complicating childbirth: Secondary | ICD-10-CM | POA: Diagnosis present

## 2014-08-11 DIAGNOSIS — O9902 Anemia complicating childbirth: Secondary | ICD-10-CM | POA: Diagnosis present

## 2014-08-11 DIAGNOSIS — O26893 Other specified pregnancy related conditions, third trimester: Secondary | ICD-10-CM | POA: Diagnosis present

## 2014-08-11 DIAGNOSIS — Z6841 Body Mass Index (BMI) 40.0 and over, adult: Secondary | ICD-10-CM

## 2014-08-11 LAB — COMPREHENSIVE METABOLIC PANEL
ALT: 11 U/L (ref 0–35)
AST: 14 U/L (ref 0–37)
Albumin: 2.6 g/dL — ABNORMAL LOW (ref 3.5–5.2)
Alkaline Phosphatase: 135 U/L — ABNORMAL HIGH (ref 39–117)
Anion gap: 12 (ref 5–15)
BUN: 7 mg/dL (ref 6–23)
CHLORIDE: 102 meq/L (ref 96–112)
CO2: 21 meq/L (ref 19–32)
CREATININE: 0.57 mg/dL (ref 0.50–1.10)
Calcium: 8.3 mg/dL — ABNORMAL LOW (ref 8.4–10.5)
GFR calc Af Amer: >90 mL/min (ref >90)
Glucose, Bld: 131 mg/dL — ABNORMAL HIGH (ref 70–99)
Potassium: 3.8 mEq/L (ref 3.7–5.3)
SODIUM: 135 meq/L — AB (ref 137–147)
Total Protein: 6.6 g/dL (ref 6.0–8.3)

## 2014-08-11 LAB — CULTURE, BLOOD (ROUTINE X 2)
CULTURE: NO GROWTH
Culture: NO GROWTH
Special Requests: 10

## 2014-08-11 LAB — PROTEIN / CREATININE RATIO, URINE
Creatinine, Urine: 274.43 mg/dL
Protein Creatinine Ratio: 0.21 — ABNORMAL HIGH (ref 0.00–0.15)
Total Protein, Urine: 58.2 mg/dL

## 2014-08-11 LAB — CBC
HCT: 30.3 % — ABNORMAL LOW (ref 36.0–46.0)
Hemoglobin: 9.6 g/dL — ABNORMAL LOW (ref 12.0–15.0)
MCH: 23.6 pg — AB (ref 26.0–34.0)
MCHC: 31.7 g/dL (ref 30.0–36.0)
MCV: 74.6 fL — ABNORMAL LOW (ref 78.0–100.0)
PLATELETS: 269 10*3/uL (ref 150–400)
RBC: 4.06 MIL/uL (ref 3.87–5.11)
RDW: 16.5 % — ABNORMAL HIGH (ref 11.5–15.5)
WBC: 10.5 10*3/uL (ref 4.0–10.5)

## 2014-08-11 LAB — TYPE AND SCREEN
ABO/RH(D): O NEG
Antibody Screen: NEGATIVE

## 2014-08-11 LAB — URIC ACID: Uric Acid, Serum: 3.4 mg/dL (ref 2.4–7.0)

## 2014-08-11 LAB — RPR

## 2014-08-11 LAB — MRSA PCR SCREENING: MRSA by PCR: NEGATIVE

## 2014-08-11 LAB — LACTATE DEHYDROGENASE: LDH: 244 U/L (ref 94–250)

## 2014-08-11 MED ORDER — MISOPROSTOL 200 MCG PO TABS
ORAL_TABLET | ORAL | Status: AC
  Filled 2014-08-11: qty 5

## 2014-08-11 MED ORDER — FAMOTIDINE 20 MG PO TABS: 40.0000 mg | ORAL_TABLET | Freq: Once | ORAL | Status: DC

## 2014-08-11 MED ORDER — BENZOCAINE-MENTHOL 20-0.5 % EX AERO: 1.0000 "application " | INHALATION_SPRAY | CUTANEOUS | Status: DC | PRN

## 2014-08-11 MED ORDER — OXYCODONE-ACETAMINOPHEN 5-325 MG PO TABS: 2.0000 | ORAL_TABLET | ORAL | Status: DC | PRN

## 2014-08-11 MED ORDER — SENNOSIDES-DOCUSATE SODIUM 8.6-50 MG PO TABS
2.0000 | ORAL_TABLET | ORAL | Status: DC
  Administered 2014-08-12 (×2): 2 via ORAL
  Filled 2014-08-11 (×2): qty 2

## 2014-08-11 MED ORDER — ONDANSETRON HCL 4 MG/2ML IJ SOLN: 4.0000 mg | Freq: Four times a day (QID) | INTRAMUSCULAR | Status: DC | PRN

## 2014-08-11 MED ORDER — TETANUS-DIPHTH-ACELL PERTUSSIS 5-2.5-18.5 LF-MCG/0.5 IM SUSP: 0.5000 mL | Freq: Once | INTRAMUSCULAR | Status: DC

## 2014-08-11 MED ORDER — LACTATED RINGERS IV SOLN: INTRAVENOUS | Status: DC

## 2014-08-11 MED ORDER — CITRIC ACID-SODIUM CITRATE 334-500 MG/5ML PO SOLN: 30.0000 mL | ORAL | Status: DC | PRN

## 2014-08-11 MED ORDER — PRENATAL MULTIVITAMIN CH
1.0000 | ORAL_TABLET | Freq: Every day | ORAL | Status: DC
  Administered 2014-08-12 – 2014-08-13 (×2): 1 via ORAL
  Filled 2014-08-11 (×2): qty 1

## 2014-08-11 MED ORDER — ONDANSETRON HCL 4 MG PO TABS: 4.0000 mg | ORAL_TABLET | ORAL | Status: DC | PRN

## 2014-08-11 MED ORDER — OXYCODONE-ACETAMINOPHEN 5-325 MG PO TABS
1.0000 | ORAL_TABLET | ORAL | Status: DC | PRN
  Administered 2014-08-11 – 2014-08-13 (×6): 1 via ORAL
  Filled 2014-08-11 (×5): qty 1

## 2014-08-11 MED ORDER — DIPHENHYDRAMINE HCL 25 MG PO CAPS: 25.0000 mg | ORAL_CAPSULE | Freq: Four times a day (QID) | ORAL | Status: DC | PRN

## 2014-08-11 MED ORDER — LACTATED RINGERS IV SOLN
INTRAVENOUS | Status: DC
  Administered 2014-08-11: 11:00:00 via INTRAVENOUS

## 2014-08-11 MED ORDER — METOCLOPRAMIDE HCL 10 MG PO TABS: 10.0000 mg | ORAL_TABLET | Freq: Once | ORAL | Status: DC

## 2014-08-11 MED ORDER — IBUPROFEN 600 MG PO TABS
600.0000 mg | ORAL_TABLET | Freq: Four times a day (QID) | ORAL | Status: DC
  Administered 2014-08-11 – 2014-08-13 (×10): 600 mg via ORAL
  Filled 2014-08-11 (×10): qty 1

## 2014-08-11 MED ORDER — WITCH HAZEL-GLYCERIN EX PADS: 1.0000 "application " | MEDICATED_PAD | CUTANEOUS | Status: DC | PRN

## 2014-08-11 MED ORDER — ACETAMINOPHEN 325 MG PO TABS: 650.0000 mg | ORAL_TABLET | ORAL | Status: DC | PRN

## 2014-08-11 MED ORDER — OXYTOCIN BOLUS FROM INFUSION
500.0000 mL | INTRAVENOUS | Status: DC
  Administered 2014-08-11: 300 mL via INTRAVENOUS
  Administered 2014-08-11: 500 mL via INTRAVENOUS

## 2014-08-11 MED ORDER — SODIUM CHLORIDE 0.9 % IV SOLN
2.0000 g | Freq: Once | INTRAVENOUS | Status: AC
  Administered 2014-08-11: 2 g via INTRAVENOUS
  Filled 2014-08-11: qty 2000

## 2014-08-11 MED ORDER — MISOPROSTOL 200 MCG PO TABS
1000.0000 ug | ORAL_TABLET | Freq: Once | ORAL | Status: AC
  Administered 2014-08-11: 1000 ug via RECTAL

## 2014-08-11 MED ORDER — OXYTOCIN 40 UNITS IN LACTATED RINGERS INFUSION - SIMPLE MED
62.5000 mL/h | INTRAVENOUS | Status: DC
  Administered 2014-08-11 (×2): 62.5 mL/h via INTRAVENOUS
  Filled 2014-08-11 (×2): qty 1000

## 2014-08-11 MED ORDER — LACTATED RINGERS IV SOLN: 500.0000 mL | INTRAVENOUS | Status: DC | PRN

## 2014-08-11 MED ORDER — ZOLPIDEM TARTRATE 5 MG PO TABS: 5.0000 mg | ORAL_TABLET | Freq: Every evening | ORAL | Status: DC | PRN

## 2014-08-11 MED ORDER — ONDANSETRON HCL 4 MG/2ML IJ SOLN: 4.0000 mg | INTRAMUSCULAR | Status: DC | PRN

## 2014-08-11 MED ORDER — DIBUCAINE 1 % RE OINT
1.0000 | TOPICAL_OINTMENT | RECTAL | Status: DC | PRN
Start: 2014-08-11 — End: 2014-08-13

## 2014-08-11 MED ORDER — SIMETHICONE 80 MG PO CHEW: 80.0000 mg | CHEWABLE_TABLET | ORAL | Status: DC | PRN

## 2014-08-11 MED ORDER — FERROUS SULFATE 325 (65 FE) MG PO TABS
325.0000 mg | ORAL_TABLET | Freq: Two times a day (BID) | ORAL | Status: DC
  Administered 2014-08-11 – 2014-08-13 (×4): 325 mg via ORAL
  Filled 2014-08-11 (×4): qty 1

## 2014-08-11 MED ORDER — LANOLIN HYDROUS EX OINT: TOPICAL_OINTMENT | CUTANEOUS | Status: DC | PRN

## 2014-08-11 MED ORDER — LIDOCAINE HCL (PF) 1 % IJ SOLN
30.0000 mL | INTRAMUSCULAR | Status: AC | PRN
  Administered 2014-08-11: 30 mL via SUBCUTANEOUS
  Filled 2014-08-11: qty 30

## 2014-08-11 MED ORDER — OXYCODONE-ACETAMINOPHEN 5-325 MG PO TABS
1.0000 | ORAL_TABLET | ORAL | Status: DC | PRN
Start: 2014-08-11 — End: 2014-08-13
  Administered 2014-08-12 – 2014-08-13 (×2): 1 via ORAL
  Filled 2014-08-11 (×3): qty 1

## 2014-08-11 NOTE — Progress Notes (Signed)
Addendum  Filed Vitals:   08/11/14 1232 08/11/14 1352 08/11/14 1445 08/11/14 1542  BP: 144/77 148/86 148/86 139/81  Pulse: 91 97 92 91  Temp:   98.3 F (36.8 C) 98.4 F (36.9 C)  TempSrc:   Oral Oral  Resp: 18 18 20 18   Height:      Weight:      SpO2:    100%   Results for orders placed or performed during the hospital encounter of 08/11/14 (from the past 24 hour(s))  CBC     Status: Abnormal   Collection Time: 08/11/14 10:35 AM  Result Value Ref Range   WBC 10.5 4.0 - 10.5 K/uL   RBC 4.06 3.87 - 5.11 MIL/uL   Hemoglobin 9.6 (L) 12.0 - 15.0 g/dL   HCT 62.930.3 (L) 52.836.0 - 41.346.0 %   MCV 74.6 (L) 78.0 - 100.0 fL   MCH 23.6 (L) 26.0 - 34.0 pg   MCHC 31.7 30.0 - 36.0 g/dL   RDW 24.416.5 (H) 01.011.5 - 27.215.5 %   Platelets 269 150 - 400 K/uL  RPR     Status: None   Collection Time: 08/11/14 10:35 AM  Result Value Ref Range   RPR NON REAC NON REAC  Uric acid     Status: None   Collection Time: 08/11/14  3:10 PM  Result Value Ref Range   Uric Acid, Serum 3.4 2.4 - 7.0 mg/dL  Lactate dehydrogenase     Status: None   Collection Time: 08/11/14  3:10 PM  Result Value Ref Range   LDH 244 94 - 250 U/L  Comprehensive metabolic panel     Status: Abnormal   Collection Time: 08/11/14  3:10 PM  Result Value Ref Range   Sodium 135 (L) 137 - 147 mEq/L   Potassium 3.8 3.7 - 5.3 mEq/L   Chloride 102 96 - 112 mEq/L   CO2 21 19 - 32 mEq/L   Glucose, Bld 131 (H) 70 - 99 mg/dL   BUN 7 6 - 23 mg/dL   Creatinine, Ser 5.360.57 0.50 - 1.10 mg/dL   Calcium 8.3 (L) 8.4 - 10.5 mg/dL   Total Protein 6.6 6.0 - 8.3 g/dL   Albumin 2.6 (L) 3.5 - 5.2 g/dL   AST 14 0 - 37 U/L   ALT 11 0 - 35 U/L   Alkaline Phosphatase 135 (H) 39 - 117 U/L   Total Bilirubin <0.2 (L) 0.3 - 1.2 mg/dL   GFR calc non Af Amer >90 >90 mL/min   GFR calc Af Amer >90 >90 mL/min   Anion gap 12 5 - 15  Protein / creatinine ratio, urine     Status: Abnormal   Collection Time: 08/11/14  3:10 PM  Result Value Ref Range   Creatinine, Urine  274.43 mg/dL   Total Protein, Urine 58.2 mg/dL   Protein Creatinine Ratio 0.21 (H) 0.00 - 0.15   Orders to monitor BP per routine

## 2014-08-11 NOTE — Progress Notes (Signed)
Robin Hodge is a 28 y.o. female, G6 P3023 at 39.2 weeks Pt arrive to MAU c/o CTX since last night.  She denies vb, lof with +FM last night but  a decrease in FM since this morning.  Pt report her GBS as negative but Jake Samplesthena has positive t/h pt will be treated with AMP 2gm for advance labor progress  Patient Active Problem List   Diagnosis Date Noted  . Normal labor 08/11/2014  . Fever and chills 08/06/2014  . Viral syndrome 08/06/2014  . Heart murmur 08/05/2014  . Severe obesity (BMI >= 40) 08/05/2014  . Group B Streptococcus carrier, +RV culture, currently pregnant (07/23/14) 08/05/2014  . Flu-like symptoms 08/05/2014  . Anemia 04/10/2014  . Susceptible to varicella (non-immune), currently pregnant 04/10/2014  . Rh negative state in antepartum period 04/10/2014  . Short interval between pregnancies affecting pregnancy in second trimester, antepartum 04/10/2014  . Dizziness 03/22/2014  . Back pain affecting pregnancy in first trimester 03/22/2014  . Allergic rhinitis, seasonal 01/13/2013    Pregnancy Course: Patient entered care at 39.2 weeks.   EDC of 08/16/14 was established by EDD.      US evaluations:   9.4 weeks - Dating: S c/w D, small SCH, FHR 160  20.1 weeks - Anatomy: EFW 12oz, FHR 148, cervical length 3.35, vertex, anterior placenta, no previa, normal cord insertion, female  31.4- FU: EFW 4lb 2oz - 50.9%, AFI 19.81, FHR 140, vertex  32.4 weeks - FU: AFI 19.0, FHR 138,  BPP 8/8, vertex,   33.4 weeks - FU: AFI 21.3, BPP 8/8, FHR 152,  34.5 weeks - FU: AFI 22.05, FHR 141, BPP 8/8, vertex, anterior placenta,     Significant prenatal events:   BMI 40+, Rh negative, anemia,  BV, varicella NOT immune, vertex, anterior placenta last evaluation:   37.4 weeks   VE:07/21/14 on 1/30-3  Reason for admission:  Advance labor  Pt States:   Contractions Frequency: 3 minutes         Contraction severity: intense         Fetal activity: decrease as of this morning  OB History    Gravida Para Term Preterm AB TAB SAB Ectopic Multiple Living   6 3 3  2  0 2   3     Past Medical History  Diagnosis Date  . Heart murmur   . Abnormal Pap smear     colpo-ok since  . Obese   . Anemia    Past Surgical History  Procedure Laterality Date  . Dilation and curettage of uterus     Family History: family history includes Diabetes in her mother; Heart disease in her father and mother; Hypertension in her mother. There is no history of Other. Social History:  reports that she has quit smoking. Her smoking use included Cigarettes. She has a .75 pack-year smoking history. She has never used smokeless tobacco. She reports that she does not drink alcohol or use illicit drugs.   Prenatal Transfer Tool  Maternal Diabetes: No Genetic Screening: Normal Maternal Ultrasounds/Referrals: Normal Fetal Ultrasounds or other Referrals:  None Maternal Substance Abuse:  No Significant Maternal Medications:  Meds include: Other: Rhogam 05/26/14 Significant Maternal Lab Results: Lab values include: Group B Strep positive, Rh negative   ROS:  See HPI above, all other systems are negative  No Known Allergies  Dilation: 10 Effacement (%): 100 Station: -1 Exam by:: V. Shin Lamour, CNM Blood pressure 145/83, pulse 98, temperature 98 F (36.7 C), temperature source Oral,  resp. rate 20, last menstrual period 10/21/2013, not currently breastfeeding.  Maternal Exam:  Uterine Assessment: Contraction frequency is rare.  Abdomen: Gravid, non tender. Fundal height is aga.  Normal external genitalia, vulva, cervix, uterus and adnexa.  No lesions noted on exam.  Pelvis adequate for delivery.  Fetal presentation: Vertex by VE  Fetal Exam:  Monitor Surveillance : Continuous Monitoring   Mode: Ultrasound.  NICHD: Category 1 CTXs: Q 3-4 minutes EFW   7.5 lbs  Physical Exam: Nursing note and vitals reviewed General: alert and cooperative She appears well nourished Psychiatric: Normal mood and  affect. Her behavior is normal Head: Normocephalic Eyes: Pupils are equal, round, and reactive to light Neck: Normal range of motion Cardiovascular: RRR without murmur  Respiratory: CTAB. Effort normal  Abd: soft, non-tender, +BS, no rebound, no guarding  Genitourinary: Vagina normal  Neurological: A&Ox3 Skin: Warm and dry  Musculoskeletal: Normal range of motion  Homan's sign negative bilaterally No evidence of DVTs.  Edema:Minimal bilaterally non-pitting edema DTR: 2+ Clonus: None   Prenatal labs: ABO, Rh: O/Negative/-- (06/10 0000) Antibody: Negative (06/10 0000) Rubella:   immune RPR: Nonreactive (06/10 0000)  HBsAg: Negative (06/10 0000)  HIV: Non-reactive (06/10 0000)  GBS: Positive (10/15 0000) Sickle cell/Hgb electrophoresis:  WNL Pap:  wnl 6/15 GC:   negative Chlamydia:  Genetic screenings:   Glucola: HSV1 positive HSV1 negative    All information will be confirmed upon admisson  Assessment:  IUP at 39.2 weeks NICHD: Category Membranes: intact w/BBW GBS positive  Plan:  Admit to L&D for expectant management of labor. GBS prophylaxis with Amp per advanced labor   IV pain medication per orders PRN Epidural per patient request Foley cath after patient is comfortable with epidural Anticipate SVD  Labor mgmt as ordered Okay to ambulate around unit with wireless monitors  Okay to get up and shower without monitoring      Attending MD available at all times.  Celestial Barnfield, CNM, MSN 08/11/2014, 10:34 AM

## 2014-08-11 NOTE — MAU Note (Signed)
Contractions since last night. No leakage of water or bleeding. Arrived via EMS.

## 2014-08-11 NOTE — Progress Notes (Signed)
New bag of pitocin to continue running after transfer of patient at a rate of 62.5 mL/hr per CNM. Will pass along in [facility].

## 2014-08-11 NOTE — Plan of Care (Signed)
Problem: Consults Goal: Postpartum Patient Education (See Patient Education module for education specifics.)  Outcome: Completed/Met Date Met:  08/11/14 Goal: Skin Care Protocol Initiated - if Braden Score 18 or less If consults are not indicated, leave blank or document N/A  Outcome: Not Applicable Date Met:  40/34/74 Goal: Nutrition Consult-if indicated Outcome: Not Applicable Date Met:  25/95/63 Goal: Diabetes Guidelines if Diabetic/Glucose > 140 If diabetic or lab glucose is > 140 mg/dl - Initiate Diabetes/Hyperglycemia Guidelines & Document Interventions  Outcome: Not Applicable Date Met:  87/56/43  Problem: Phase I Progression Outcomes Goal: Pain controlled with appropriate interventions Outcome: Completed/Met Date Met:  08/11/14 Goal: Voiding adequately Outcome: Completed/Met Date Met:  08/11/14 Goal: Foley catheter patent Outcome: Not Applicable Date Met:  32/95/18 Goal: OOB as tolerated unless otherwise ordered Outcome: Completed/Met Date Met:  08/11/14 Goal: IS, TCDB as ordered Outcome: Not Applicable Date Met:  84/16/60 Goal: VS, stable, temp < 100.4 degrees F Outcome: Completed/Met Date Met:  08/11/14 Goal: Initial discharge plan identified Outcome: Completed/Met Date Met:  08/11/14 Goal: Other Phase I Outcomes/Goals Outcome: Not Applicable Date Met:  63/01/60

## 2014-08-11 NOTE — Progress Notes (Signed)
Addendum Primary care nurse called c/o increase VB.     Uterus boggy immediately, fundal massage given, a small amount of clots expressed. of Cytotec PR. Fundas firm, bleeding under control.  Ethell Blatchford, CNM

## 2014-08-11 NOTE — Progress Notes (Signed)
Addendum Nurse called informing of elevated BP's Filed Vitals:   08/11/14 1232 08/11/14 1352 08/11/14 1445 08/11/14 1542  BP: 144/77 148/86 148/86 139/81  Pulse: 91 97 92 91  Temp:   98.3 F (36.8 C) 98.4 F (36.9 C)  TempSrc:   Oral Oral  Resp: 18 18 20 18   Height:      Weight:      SpO2:    100%   [facility]

## 2014-08-11 NOTE — H&P (Signed)
Robin Hodge is a 28 y.o. female, G6 P3023 at 39.2 weeks Pt arrive to MAU c/o CTX since last night. She denies vb, lof with +FM last night but a decrease in FM since this morning. Pt report her GBS as negative but Jake Samplesthena has positive t/h pt will be treated with AMP 2gm for advance labor progress.  Pt desires a PP BTL.  Signed consent in the cart dates 05/26/14  Patient Active Problem List   Diagnosis Date Noted  . Normal labor 08/11/2014  . Fever and chills 08/06/2014  . Viral syndrome 08/06/2014  . Heart murmur 08/05/2014  . Severe obesity (BMI >= 40) 08/05/2014  . Group B Streptococcus carrier, +RV culture, currently pregnant (07/23/14) 08/05/2014  . Flu-like symptoms 08/05/2014  . Anemia 04/10/2014  . Susceptible to varicella (non-immune), currently pregnant 04/10/2014  . Rh negative state in antepartum period 04/10/2014  . Short interval between pregnancies affecting pregnancy in second trimester, antepartum 04/10/2014  . Dizziness 03/22/2014  . Back pain affecting pregnancy in first trimester 03/22/2014  . Allergic rhinitis, seasonal 01/13/2013    Pregnancy Course: Patient entered care at 39.2 weeks.  EDC of 08/16/14 was established by EDD.    US evaluations: 9.4 weeks - Dating: S c/w D, small SCH, FHR 160 20.1 weeks - Anatomy: EFW 12oz, FHR 148, cervical length 3.35, vertex, anterior placenta, no previa, normal cord insertion, female 31.4- FU: EFW 4lb 2oz - 50.9%, AFI 19.81, FHR 140, vertex 32.4 weeks - FU: AFI 19.0, FHR 138, BPP 8/8, vertex,  33.4 weeks - FU: AFI 21.3, BPP 8/8, FHR 152, 34.5 weeks - FU: AFI 22.05, FHR 141, BPP 8/8, vertex, anterior placenta,    Significant prenatal events: BMI 40+, Rh negative, anemia, BV, varicella NOT immune, vertex, anterior placenta last evaluation: 37.4 weeks  VE:07/21/14 on 1/30-3  Reason for admission: Advance labor  Pt States:Contractions Frequency: 3 minutes  Contraction severity: intense  Fetal activity: decrease as of this morning  OB History    Gravida Para Term Preterm AB TAB SAB Ectopic Multiple Living   6 3 3  2  0 2   3     Past Medical History  Diagnosis Date  . Heart murmur   . Abnormal Pap smear     colpo-ok since  . Obese   . Anemia    Past Surgical History  Procedure Laterality Date  . Dilation and curettage of uterus     Family History: family history includes Diabetes in her mother; Heart disease in her father and mother; Hypertension in her mother. There is no history of Other. Social History:  reports that she has quit smoking. Her smoking use included Cigarettes. She has a .75 pack-year smoking history. She has never used smokeless tobacco. She reports that she does not drink alcohol or use illicit drugs.   Prenatal Transfer Tool  Maternal Diabetes:No Genetic Screening:Normal Maternal Ultrasounds/Referrals: Normal Fetal Ultrasounds or other Referrals: None Maternal Substance Abuse: No Significant Maternal Medications: Meds include: Other: Rhogam 05/26/14 Significant Maternal Lab Results: Lab values include: Group B Strep positive, Rh negative   ROS: See HPI above, all other systems are negative  No Known Allergies  Dilation: 10 Effacement (%): 100 Station: -1 Exam by:: V. Brithney Bensen, CNM Blood pressure 145/83, pulse 98, temperature 98 F (36.7 C), temperature source Oral, resp. rate 20, last menstrual period 10/21/2013, not currently breastfeeding.  Maternal Exam:  Uterine Assessment: Contraction frequency is rare.  Abdomen: Gravid, non tender. Fundal height is aga.  Normal  external genitalia, vulva,  cervix, uterus and adnexa.  No lesions noted on exam.  Pelvis adequate for delivery.  Fetal presentation: Vertex by VE  Fetal Exam:  Monitor Surveillance : Continuous Monitoring  Mode: Ultrasound.  NICHD: Category 1 CTXs: Q 3-4 minutes EFW 7.5 lbs  Physical Exam: Nursing note and vitals reviewed General: alert and cooperative She appears well nourished Psychiatric: Normal mood and affect. Her behavior is normal Head: Normocephalic Eyes: Pupils are equal, round, and reactive to light Neck: Normal range of motion Cardiovascular: RRR without murmur  Respiratory: CTAB. Effort normal  Abd: soft, non-tender, +BS, no rebound, no guarding  Genitourinary: Vagina normal  Neurological: A&Ox3 Skin: Warm and dry  Musculoskeletal: Normal range of motion  Homan's sign negative bilaterally No evidence of DVTs.  Edema:Minimal bilaterally non-pitting edema DTR: 2+ Clonus: None   Prenatal labs: ABO, Rh: O/Negative/-- (06/10 0000) Antibody: Negative (06/10 0000) Rubella:  immune RPR: Nonreactive (06/10 0000)  HBsAg: Negative (06/10 0000)  HIV: Non-reactive (06/10 0000)  GBS: Positive (10/15 0000) Sickle cell/Hgb electrophoresis: WNL Pap: wnl 6/15 GC: negative Chlamydia:  Genetic screenings:  Glucola: HSV1 positive HSV1 negative   All information will be confirmed upon admisson  Assessment:  IUP at 39.2 weeks NICHD: Category Membranes: intact w/BBW GBS positive  Plan:  Admit to L&D for expectant management of labor. GBS prophylaxis with Amp per advanced labor   IV pain medication per orders PRN Epidural per patient request Foley cath after patient is comfortable with epidural Anticipate SVD  Labor mgmt as ordered Okay to ambulate around unit with wireless monitors  Okay to get up and shower without monitoring      Attending MD available at all times.  Jocelin Schuelke, CNM, MSN 08/11/2014, 10:34 AM

## 2014-08-12 ENCOUNTER — Encounter (HOSPITAL_COMMUNITY): Payer: Self-pay | Admitting: Anesthesiology

## 2014-08-12 ENCOUNTER — Encounter (HOSPITAL_COMMUNITY)
Admission: AD | Disposition: A | Discharge status: Home / Self Care | Payer: Self-pay | Source: Ambulatory Visit | Attending: Obstetrics and Gynecology

## 2014-08-12 LAB — CBC
HCT: 25.6 % — ABNORMAL LOW (ref 36.0–46.0)
Hemoglobin: 8.1 g/dL — ABNORMAL LOW (ref 12.0–15.0)
MCH: 23.7 pg — AB (ref 26.0–34.0)
MCHC: 31.6 g/dL (ref 30.0–36.0)
MCV: 74.9 fL — ABNORMAL LOW (ref 78.0–100.0)
PLATELETS: 268 10*3/uL (ref 150–400)
RBC: 3.42 MIL/uL — ABNORMAL LOW (ref 3.87–5.11)
RDW: 16.7 % — ABNORMAL HIGH (ref 11.5–15.5)
WBC: 9.8 10*3/uL (ref 4.0–10.5)

## 2014-08-12 SURGERY — LIGATION, FALLOPIAN TUBE, POSTPARTUM
Anesthesia: Choice | Laterality: Bilateral

## 2014-08-12 MED ORDER — RHO D IMMUNE GLOBULIN 1500 UNIT/2ML IJ SOSY
300.0000 ug | PREFILLED_SYRINGE | Freq: Once | INTRAMUSCULAR | Status: AC
  Administered 2014-08-12: 300 ug via INTRAVENOUS
  Filled 2014-08-12: qty 2

## 2014-08-12 MED ORDER — DEXTROMETHORPHAN POLISTIREX 30 MG/5ML PO LQCR
30.0000 mg | Freq: Two times a day (BID) | ORAL | Status: DC
  Administered 2014-08-12 – 2014-08-13 (×3): 30 mg via ORAL
  Filled 2014-08-12 (×5): qty 5

## 2014-08-12 MED ORDER — IBUPROFEN 600 MG PO TABS: 600.0000 mg | ORAL_TABLET | Freq: Four times a day (QID) | ORAL | Status: DC | PRN

## 2014-08-12 MED ORDER — CEPASTAT 14.5 MG MT LOZG
1.0000 | LOZENGE | OROMUCOSAL | Status: DC | PRN
  Administered 2014-08-12: 1 via BUCCAL
  Filled 2014-08-12: qty 9

## 2014-08-12 SURGICAL SUPPLY — 28 items
CHLORAPREP W/TINT 26ML (MISCELLANEOUS) IMPLANT
CLOSURE WOUND 1/2 X4 (GAUZE/BANDAGES/DRESSINGS)
CLOTH BEACON ORANGE TIMEOUT ST (SAFETY) IMPLANT
CONTAINER PREFILL 10% NBF 15ML (MISCELLANEOUS) IMPLANT
DRSG OPSITE POSTOP 3X4 (GAUZE/BANDAGES/DRESSINGS) IMPLANT
ELECT REM PT RETURN 9FT ADLT (ELECTROSURGICAL)
ELECTRODE REM PT RTRN 9FT ADLT (ELECTROSURGICAL) IMPLANT
GLOVE BIO SURGEON STRL SZ 6.5 (GLOVE) IMPLANT
GLOVE BIO SURGEONS STRL SZ 6.5 (GLOVE)
GLOVE BIOGEL PI IND STRL 7.0 (GLOVE) IMPLANT
GLOVE BIOGEL PI INDICATOR 7.0 (GLOVE)
GOWN STRL REUS W/TWL LRG LVL3 (GOWN DISPOSABLE) IMPLANT
NEEDLE HYPO 25X1 1.5 SAFETY (NEEDLE) IMPLANT
NS IRRIG 1000ML POUR BTL (IV SOLUTION) IMPLANT
PACK ABDOMINAL MINOR (CUSTOM PROCEDURE TRAY) IMPLANT
PENCIL BUTTON HOLSTER BLD 10FT (ELECTRODE) IMPLANT
SPONGE LAP 4X18 X RAY DECT (DISPOSABLE) IMPLANT
STRIP CLOSURE SKIN 1/2X4 (GAUZE/BANDAGES/DRESSINGS) IMPLANT
SUT MNCRL AB 3-0 PS2 27 (SUTURE) IMPLANT
SUT PLAIN 2 0 (SUTURE)
SUT PLAIN ABS 2-0 CT1 27XMFL (SUTURE) IMPLANT
SUT VIC AB 0 CT1 27 (SUTURE)
SUT VIC AB 0 CT1 27XBRD ANBCTR (SUTURE) IMPLANT
SYR BULB IRRIGATION 50ML (SYRINGE) IMPLANT
SYR CONTROL 10ML LL (SYRINGE) IMPLANT
TOWEL OR 17X24 6PK STRL BLUE (TOWEL DISPOSABLE) IMPLANT
TRAY FOLEY CATH 14FR (SET/KITS/TRAYS/PACK) IMPLANT
WATER STERILE IRR 1000ML POUR (IV SOLUTION) IMPLANT

## 2014-08-12 NOTE — Progress Notes (Addendum)
Subjective: Postpartum Day 1: Vaginal delivery, no laceration Patient up ad lib, reports no syncope or dizziness. Feeding:  Bottle Contraceptive plan:  BTL planned today  Has mild cough--denies fever, chills.  Had recent hospitalization 10/28-10/29 for viral sx--flu testing negative, fever resolved.  Objective: Vital signs in last 24 hours: Temp:  [97.8 F (36.6 C)-98.4 F (36.9 C)] 97.8 F (36.6 C) (11/04 16100611) Pulse Rate:  [83-106] 83 (11/04 0611) Resp:  [18-22] 20 (11/04 0611) BP: (138-157)/(71-86) 138/82 mmHg (11/04 0611) SpO2:  [100 %] 100 % (11/03 1542) Weight:  [284 lb (128.822 kg)] 284 lb (128.822 kg) (11/03 1035)   Orthostatics stable.  Physical Exam:  General: alert  Chest clear Heart RRR without murmur Lochia: appropriate Uterine Fundus: firm Perineum: Intact DVT Evaluation: No evidence of DVT seen on physical exam. Negative Homan's sign.    Recent Labs  08/11/14 1035 08/12/14 0530  HGB 9.6* 8.1*  HCT 30.3* 25.6*    Assessment/Plan: Status post vaginal delivery day 1 RH negative--baby O+ Tubal scheduled at noon Anemia without hemdynamic instability Stable Continue current care. Fe BID Recheck CBC tomorrow Rhophylac prior to d/c. Plan for discharge tomorrow    Nyra CapesLATHAM, VICKICNM 08/12/2014, 8:05 AM    Addendum: Patient now declines tubal--wants to plan at 6 weeks. Mild sore throat and cough--will give lozenges for prn use, cough med. Dr. Normand Sloopillard and OR notified to cancel tubal.  Nigel BridgemanVicki Estefanie Cornforth, CNM 08/12/14 [phone]

## 2014-08-12 NOTE — Discharge Summary (Signed)
Vaginal Delivery Discharge Summary  ALL information will be verified prior to discharge  Robin Cotaiana H Shaddix  DOB:    September 01, 1986 MRN:    [id number] CSN:    [id number]  Date of admission:                  08/11/14  Date of discharge:                   08/13/14  Procedures this admission: SVD by Jacqulin Brandenburger CNM  Date of Delivery: 08/11/14  Newborn Data:  Live born  Information for the patient's newborn:  Rica MastJones, Boy Janyia [[id number]][[id number]]  female   Live born female  Birth Weight: 8 lb 10.5 oz (3925 g) APGAR: 9, 9  Home with mother. Name: Kion Circumcision Plan: out patient  History of Present Illness: Ms. Robin Hodge is a 28 y.o. female, [phone], who presents at 5228w2d weeks gestation. [facility]. She was admitted onset of labor. Her pregnancy has been complicated by:  Patient Active Problem List   Diagnosis Date Noted  . Vaginal delivery 08/11/2014  . Susceptible to varicella (non-immune), currently pregnant 04/10/2014  . Rh negative state in antepartum period 04/10/2014    Hospital course: The patient was admitted for labor.   Her labor was not complicated. She proceeded to have a vaginal delivery of a healthy infant. Her delivery was not complicated. Her postpartum course was not complicated. She was discharged to home on postpartum day 2 doing well.  Feeding: bottle  Contraception: Depo-Provera now, BTl in 6 weeks Pt understands the risks birth control are not limited to irregular bleeding, formation of DVT, fluid fluctuations, elevation in blood pressure, stroke, breast tenderness and liver damage.  She states she will report any serious side effects.  She has been given verbal and written instructions and voiced a clear understanding.    Discharge hemoglobin: .labs HEMOGLOBIN  Date Value Ref Range Status  08/12/2014 8.1* 12.0 - 15.0 g/dL Final   HCT  Date Value  Ref Range Status  08/12/2014 25.6* 36.0 - 46.0 % Final  Hgb 08/13/2014           7.6  Anemia - Hemodynamically unstable.  Pt refused transfusion Pt denies SOB, HA, dizziness Orthostatics unstable  Nurse report the difference with the BP at 0916 and 0917 was a trip to the BR.  Pt denies dizziness and still refuses a tranfusion Filed Vitals:   08/13/14 0915 08/13/14 0916 08/13/14 0917 08/13/14 0927  BP: 135/94 141/108 149/46 155/96  Pulse: 94 112 79 85  Temp:      TempSrc:      Resp:      Height:      Weight:      SpO2:        . Filed Vitals:   08/12/14 0611 08/12/14 0800 08/12/14 1805 08/13/14 0544  BP: 138/82 137/87 142/87 141/79  Pulse: 83 80 84 83  Temp: 97.8 F (36.6 C) 97.8 F (36.6 C) 98.3 F (36.8 C) 98.9 F (37.2 C)  TempSrc: Oral Oral Oral Oral  Resp: 20 18 20    Height:      Weight:      SpO2:  100%  100%   Pt report her BP elevates after each pregnancy .  PreNatal Labs ABO, Rh: --/--/O NEG (11/04 0530)  Rhogam given on: 08/12/14 Antibody: NEG (11/03 1035) Rubella:  immune    RPR: NON REAC (11/03 1035)  HBsAg: Negative (06/10 0000)  HIV: Non-reactive (06/10 0000)  GBS: Positive (10/15 0000)  Discharge Physical Exam:  General: alert, cooperative and morbidly obese Lochia: appropriate Uterine Fundus: firm Incision: n/a DVT Evaluation: No evidence of DVT seen on physical exam.  Intrapartum Procedures: spontaneous vaginal delivery and GBS prophylaxis for advance labor Postpartum Procedures: none Complications-Operative and Postpartum: none  Discharge Diagnoses: Term Pregnancy-delivered, hypertension  Activity:           pelvic rest Diet:                routine Medications: PNV, Ibuprofen, Iron and Percocet, procardia 200mg  xl QD Condition:      stable      Postpartum Teaching: Nutrition, exercise, return to work or school, family visits, sexual activity, home rest, vaginal bleeding, pelvic rest, family planning, s/s of PPD, and breast care  peri-care   Discharge to: home  [facility] nurse to visit on Saturday for BP check Office next week [facility] at [facility] visit  Follow-up Information    Follow up with [facility]. Schedule an appointment as soon as possible for a visit in 6 weeks.   Specialty:  Obstetrics and Gynecology   Why:  For a 6 week postpartum visit   Contact information:   [address] [place] [address] [phone]       Talisa Petrak, CNM, MSN 08/12/2014. 10:34 PM   Postpartum Care After Vaginal Delivery  After you deliver your newborn (postpartum period), the usual stay in the hospital is 24 72 hours. If there were problems with your labor or delivery, or if you have other medical problems, you might be in the hospital longer.  While you are in the hospital, you will receive help and instructions on how to care for yourself and your newborn during the postpartum period.  While you are in the hospital:  Be sure to tell your nurses if you have pain or discomfort, as well as where you feel the pain and what makes the pain worse.  If you had an incision made near your vagina (episiotomy) or if you had some tearing during delivery, the nurses may put ice packs on your episiotomy or tear. The ice packs may help to reduce the pain and swelling.  If you are breastfeeding, you may feel uncomfortable contractions of your uterus for a couple of weeks. This is normal. The contractions help your uterus get back to normal size.  It is normal to have some bleeding after delivery.  For the first 1 3 days after delivery, the flow is red and the amount may be similar to a period.  It is common for the flow to start and stop.  In the first few days, you may pass some small clots. Let your nurses know if you begin to pass large clots or your flow increases.  Do not  flush blood clots down the toilet before having the nurse look at them.  During the next 3  10 days after delivery, your flow should become more watery and pink or brown-tinged in color.  Ten to fourteen days after delivery, your flow should be a small amount of yellowish-white discharge.  The amount of your flow will decrease over the first few weeks after delivery. Your flow may stop in 6 8 weeks. Most women have had their flow stop by 12 weeks after delivery.  You should change  your sanitary pads frequently.  Wash your hands thoroughly with soap and water for at least 20 seconds after changing pads, using the toilet, or before holding or feeding your newborn.  You should feel like you need to empty your bladder within the first 6 8 hours after delivery.  In case you become weak, lightheaded, or faint, call your nurse before you get out of bed for the first time and before you take a shower for the first time.  Within the first few days after delivery, your breasts may begin to feel tender and full. This is called engorgement. Breast tenderness usually goes away within 48 72 hours after engorgement occurs. You may also notice milk leaking from your breasts. If you are not breastfeeding, do not stimulate your breasts. Breast stimulation can make your breasts produce more milk.  Spending as much time as possible with your newborn is very important. During this time, you and your newborn can feel close and get to know each other. Having your newborn stay in your room (rooming in) will help to strengthen the bond with your newborn. It will give you time to get to know your newborn and become comfortable caring for your newborn.  Your hormones change after delivery. Sometimes the hormone changes can temporarily cause you to feel sad or tearful. These feelings should not last more than a few days. If these feelings last longer than that, you should talk to your caregiver.  If desired, talk to your caregiver about methods of family planning or contraception.  Talk to your caregiver about  immunizations. Your caregiver may want you to have the following immunizations before leaving the hospital:  Tetanus, diphtheria, and pertussis (Tdap) or tetanus and diphtheria (Td) immunization. It is very important that you and your family (including grandparents) or others caring for your newborn are up-to-date with the Tdap or Td immunizations. The Tdap or Td immunization can help protect your newborn from getting ill.  Rubella immunization.  Varicella (chickenpox) immunization.  Influenza immunization. You should receive this annual immunization if you did not receive the immunization during your pregnancy. Document Released: 07/23/2007 Document Revised: 06/19/2012 Document Reviewed: 05/22/2012 [facility] Patient Information 2014 [place], [place].   Postpartum Depression and Baby Blues  The postpartum period begins right after the birth of a baby. During this time, there is often a great amount of joy and excitement. It is also a time of considerable changes in the life of the parent(s). Regardless of how many times a mother gives birth, each child brings new challenges and dynamics to the family. It is not unusual to have feelings of excitement accompanied by confusing shifts in moods, emotions, and thoughts. All mothers are at risk of developing postpartum depression or the "baby blues." These mood changes can occur right after giving birth, or they may occur many months after giving birth. The baby blues or postpartum depression can be mild or severe. Additionally, postpartum depression can resolve rather quickly, or it can be a long-term condition. CAUSES Elevated hormones and their rapid decline are thought to be a main cause of postpartum depression and the baby blues. There are a number of hormones that radically change during and after pregnancy. Estrogen and progesterone usually decrease immediately after delivering your baby. The level of thyroid hormone and various cortisol steroids  also rapidly drop. Other factors that play a major role in these changes include major life events and genetics.  RISK FACTORS If you have any of the following risks  for the baby blues or postpartum depression, know what symptoms to watch out for during the postpartum period. Risk factors that may increase the likelihood of getting the baby blues or postpartum depression include: 1. Havinga personal or family history of depression. 2. Having depression while being pregnant. 3. Having premenstrual or oral contraceptive-associated mood issues. 4. Having exceptional life stress. 5. Having marital conflict. 6. Lacking a social support network. 7. Having a baby with special needs. 8. Having health problems such as diabetes. SYMPTOMS Baby blues symptoms include:  Brief fluctuations in mood, such as going from extreme happiness to sadness.  Decreased concentration.  Difficulty sleeping.  Crying spells, tearfulness.  Irritability.  Anxiety. Postpartum depression symptoms typically begin within the first month after giving birth. These symptoms include:  Difficulty sleeping or excessive sleepiness.  Marked weight loss.  Agitation.  Feelings of worthlessness.  Lack of interest in activity or food. Postpartum psychosis is a very concerning condition and can be dangerous. Fortunately, it is rare. Displaying any of the following symptoms is cause for immediate medical attention. Postpartum psychosis symptoms include:  Hallucinations and delusions.  Bizarre or disorganized behavior.  Confusion or disorientation. DIAGNOSIS  A diagnosis is made by an evaluation of your symptoms. There are no medical or lab tests that lead to a diagnosis, but there are various questionnaires that a caregiver may use to identify those with the baby blues, postpartum depression, or psychosis. Often times, a screening tool called the [place] Postnatal Depression Scale is used to diagnose depression in the  postpartum period.  TREATMENT The baby blues usually goes away on its own in 1 to 2 weeks. Social support is often all that is needed. You should be encouraged to get adequate sleep and rest. Occasionally, you may be given medicines to help you sleep.  Postpartum depression requires treatment as it can last several months or longer if it is not treated. Treatment may include individual or group therapy, medicine, or both to address any social, physiological, and psychological factors that may play a role in the depression. Regular exercise, a healthy diet, rest, and social support may also be strongly recommended.  Postpartum psychosis is more serious and needs treatment right away. Hospitalization is often needed. HOME CARE INSTRUCTIONS  Get as much rest as you can. Nap when the baby sleeps.  Exercise regularly. Some women find yoga and walking to be beneficial.  Eat a balanced and nourishing diet.  Do little things that you enjoy. Have a cup of tea, take a bubble bath, read your favorite magazine, or listen to your favorite music.  Avoid alcohol.  Ask for help with household chores, cooking, grocery shopping, or running errands as needed. Do not try to do everything.  Talk to people close to you about how you are feeling. Get support from your partner, family members, friends, or other new moms.  Try to stay positive in how you think. Think about the things you are grateful for.  Do not spend a lot of time alone.  Only take medicine as directed by your caregiver.  Keep all your postpartum appointments.  Let your caregiver know if you have any concerns. SEEK MEDICAL CARE IF: You are having a reaction or problems with your medicine. SEEK IMMEDIATE MEDICAL CARE IF:  You have suicidal feelings.  You feel you may harm the baby or someone else. Document Released: 06/29/2004 Document Revised: 12/18/2011 Document Reviewed: 08/01/2011 [facility] Patient Information 2014 [employer]  [place].

## 2014-08-12 NOTE — Progress Notes (Signed)
UR chart review completed.  

## 2014-08-13 LAB — CBC
HCT: 24 % — ABNORMAL LOW (ref 36.0–46.0)
Hemoglobin: 7.6 g/dL — ABNORMAL LOW (ref 12.0–15.0)
MCH: 23.6 pg — ABNORMAL LOW (ref 26.0–34.0)
MCHC: 31.7 g/dL (ref 30.0–36.0)
MCV: 74.5 fL — ABNORMAL LOW (ref 78.0–100.0)
PLATELETS: 281 10*3/uL (ref 150–400)
RBC: 3.22 MIL/uL — AB (ref 3.87–5.11)
RDW: 16.6 % — ABNORMAL HIGH (ref 11.5–15.5)
WBC: 8.6 10*3/uL (ref 4.0–10.5)

## 2014-08-13 LAB — COMPREHENSIVE METABOLIC PANEL
ALK PHOS: 112 U/L (ref 39–117)
ALT: 13 U/L (ref 0–35)
AST: 24 U/L (ref 0–37)
Albumin: 2.3 g/dL — ABNORMAL LOW (ref 3.5–5.2)
Anion gap: 13 (ref 5–15)
BUN: 9 mg/dL (ref 6–23)
CO2: 24 mEq/L (ref 19–32)
Calcium: 8.4 mg/dL (ref 8.4–10.5)
Chloride: 103 mEq/L (ref 96–112)
Creatinine, Ser: 0.72 mg/dL (ref 0.50–1.10)
GFR calc Af Amer: >90 mL/min (ref >90)
GFR calc non Af Amer: >90 mL/min (ref >90)
Glucose, Bld: 91 mg/dL (ref 70–99)
POTASSIUM: 4.3 meq/L (ref 3.7–5.3)
SODIUM: 140 meq/L (ref 137–147)
Total Bilirubin: <0.2 mg/dL — ABNORMAL LOW (ref 0.3–1.2)
Total Protein: 6.1 g/dL (ref 6.0–8.3)

## 2014-08-13 LAB — RH IG WORKUP (INCLUDES ABO/RH)
ABO/RH(D): O NEG
Fetal Screen: NEGATIVE
Gestational Age(Wks): 39
UNIT DIVISION: 0

## 2014-08-13 LAB — URIC ACID: Uric Acid, Serum: 3.9 mg/dL (ref 2.4–7.0)

## 2014-08-13 LAB — LACTATE DEHYDROGENASE: LDH: 204 U/L (ref 94–250)

## 2014-08-13 MED ORDER — MEDROXYPROGESTERONE ACETATE 150 MG/ML IM SUSP
150.0000 mg | Freq: Once | INTRAMUSCULAR | Status: AC
  Administered 2014-08-13: 150 mg via INTRAMUSCULAR
  Filled 2014-08-13: qty 1

## 2014-08-13 MED ORDER — NIFEDIPINE ER 30 MG PO TB24: 30.0000 mg | ORAL_TABLET | Freq: Every day | ORAL | Status: DC

## 2014-08-13 MED ORDER — OXYCODONE-ACETAMINOPHEN 5-325 MG PO TABS: 1.0000 | ORAL_TABLET | ORAL | Status: DC | PRN

## 2014-08-13 MED ORDER — LABETALOL HCL 100 MG PO TABS: 100.0000 mg | ORAL_TABLET | Freq: Two times a day (BID) | ORAL | Status: DC

## 2014-08-13 MED ORDER — NIFEDIPINE 10 MG PO CAPS: 30.0000 mg | ORAL_CAPSULE | Freq: Every day | ORAL | Status: DC

## 2014-08-13 MED ORDER — NIFEDIPINE ER OSMOTIC RELEASE 30 MG PO TB24
30.0000 mg | ORAL_TABLET | Freq: Every day | ORAL | Status: DC
  Administered 2014-08-13: 30 mg via ORAL
  Filled 2014-08-13: qty 1

## 2014-08-13 MED ORDER — LABETALOL HCL 100 MG PO TABS
100.0000 mg | ORAL_TABLET | Freq: Two times a day (BID) | ORAL | Status: DC
  Administered 2014-08-13: 100 mg via ORAL
  Filled 2014-08-13: qty 1

## 2014-08-13 MED ORDER — CEPASTAT 14.5 MG MT LOZG: 1.0000 | LOZENGE | OROMUCOSAL | Status: DC | PRN

## 2014-08-13 NOTE — Plan of Care (Signed)
Problem: Discharge Progression Outcomes Goal: Barriers To Progression Addressed/Resolved Outcome: Completed/Met Date Met:  87/19/59 Goal: Complications resolved/controlled Outcome: Completed/Met Date Met:  08/13/14 Goal: Pain controlled with appropriate interventions Outcome: Completed/Met Date Met:  08/13/14 Goal: Afebrile, VS remain stable at discharge Outcome: Completed/Met Date Met:  08/13/14 Goal: Discharge plan in place and appropriate Outcome: Completed/Met Date Met:  08/13/14 Goal: Other Discharge Outcomes/Goals Outcome: Completed/Met Date Met:  08/13/14

## 2014-08-13 NOTE — Plan of Care (Signed)
Problem: Phase II Progression Outcomes Goal: Pain controlled on oral analgesia Outcome: Completed/Met Date Met:  08/13/14 Goal: Progress activity as tolerated unless otherwise ordered Outcome: Completed/Met Date Met:  08/13/14 Goal: Afebrile, VS remain stable Outcome: Completed/Met Date Met:  08/13/14 Goal: Rh isoimmunization per orders Outcome: Completed/Met Date Met:  08/13/14 Goal: Tolerating diet Outcome: Completed/Met Date Met:  08/13/14 Goal: Other Phase II Outcomes/Goals Outcome: Completed/Met Date Met:  08/13/14  Problem: Discharge Progression Outcomes Goal: Activity appropriate for discharge plan Outcome: Completed/Met Date Met:  08/13/14 Goal: Tolerating diet Outcome: Completed/Met Date Met:  08/13/14

## 2014-08-13 NOTE — Progress Notes (Addendum)
Informed CNM of Patient's blood pressure of 179/95, but patient is also talking with family about a stressful situation in which she has not been able to pay her rent the past two months.  [facility] is scheduled to see her tomorrow evening to check blood pressure.    Vivi MartensAshley Tyqwan Pink RN

## 2014-09-25 ENCOUNTER — Encounter (HOSPITAL_COMMUNITY): Payer: Self-pay

## 2014-09-29 ENCOUNTER — Other Ambulatory Visit: Payer: Self-pay | Admitting: Obstetrics & Gynecology

## 2014-10-06 NOTE — Anesthesia Preprocedure Evaluation (Addendum)
Anesthesia Evaluation  Patient identified by MRN, date of birth, ID band Patient awake    Reviewed: Allergy & Precautions, H&P , NPO status , Patient's Chart, lab work & pertinent test results  History of Anesthesia Complications Negative for: history of anesthetic complications  Airway Mallampati: III  TM Distance: >3 FB Neck ROM: Full    Dental no notable dental hx. (+) Dental Advisory Given   Pulmonary Current Smoker,  breath sounds clear to auscultation  Pulmonary exam normal       Cardiovascular hypertension, negative cardio ROS  + Valvular Problems/Murmurs Rhythm:Regular Rate:Normal     Neuro/Psych PSYCHIATRIC DISORDERS Anxiety Depression negative neurological ROS     GI/Hepatic Neg liver ROS, GERD-  Medicated and Controlled,  Endo/Other  Morbid obesity  Renal/GU negative Renal ROS  negative genitourinary   Musculoskeletal negative musculoskeletal ROS (+)   Abdominal (+) + obese,   Peds negative pediatric ROS (+)  Hematology  (+) anemia ,   Anesthesia Other Findings   Reproductive/Obstetrics negative OB ROS                            Anesthesia Physical Anesthesia Plan  ASA: III  Anesthesia Plan: General   Post-op Pain Management:    Induction: Intravenous  Airway Management Planned: Oral ETT  Additional Equipment:   Intra-op Plan:   Post-operative Plan: Extubation in OR  Informed Consent: I have reviewed the patients History and Physical, chart, labs and discussed the procedure including the risks, benefits and alternatives for the proposed anesthesia with the patient or authorized representative who has indicated his/her understanding and acceptance.   Dental advisory given  Plan Discussed with: CRNA  Anesthesia Plan Comments:        Anesthesia Quick Evaluation

## 2014-10-07 ENCOUNTER — Ambulatory Visit (HOSPITAL_COMMUNITY): Payer: Medicaid Other | Admitting: Anesthesiology

## 2014-10-07 ENCOUNTER — Ambulatory Visit: Payer: Medicaid Other

## 2014-10-07 ENCOUNTER — Ambulatory Visit (HOSPITAL_COMMUNITY)
Admission: RE | Admit: 2014-10-07 | Discharge: 2014-10-07 | Disposition: A | Discharge status: Home / Self Care | Payer: Medicaid Other | Source: Ambulatory Visit | Attending: Obstetrics & Gynecology | Admitting: Obstetrics & Gynecology

## 2014-10-07 ENCOUNTER — Encounter (HOSPITAL_COMMUNITY)
Admission: RE | Disposition: A | Discharge status: Home / Self Care | Payer: Self-pay | Source: Ambulatory Visit | Attending: Obstetrics & Gynecology

## 2014-10-07 DIAGNOSIS — Z87891 Personal history of nicotine dependence: Secondary | ICD-10-CM | POA: Diagnosis not present

## 2014-10-07 DIAGNOSIS — Z862 Personal history of diseases of the blood and blood-forming organs and certain disorders involving the immune mechanism: Secondary | ICD-10-CM | POA: Diagnosis not present

## 2014-10-07 DIAGNOSIS — F329 Major depressive disorder, single episode, unspecified: Secondary | ICD-10-CM | POA: Diagnosis not present

## 2014-10-07 DIAGNOSIS — Z79899 Other long term (current) drug therapy: Secondary | ICD-10-CM | POA: Diagnosis not present

## 2014-10-07 DIAGNOSIS — Z302 Encounter for sterilization: Secondary | ICD-10-CM | POA: Diagnosis present

## 2014-10-07 DIAGNOSIS — K219 Gastro-esophageal reflux disease without esophagitis: Secondary | ICD-10-CM | POA: Insufficient documentation

## 2014-10-07 HISTORY — DX: Anxiety disorder, unspecified: F41.9

## 2014-10-07 HISTORY — DX: Depression, unspecified: F32.A

## 2014-10-07 HISTORY — DX: Gastro-esophageal reflux disease without esophagitis: K21.9

## 2014-10-07 HISTORY — PX: LAPAROSCOPIC BILATERAL SALPINGECTOMY: SHX5889

## 2014-10-07 HISTORY — DX: Major depressive disorder, single episode, unspecified: F32.9

## 2014-10-07 LAB — BASIC METABOLIC PANEL
ANION GAP: 7 (ref 5–15)
BUN: 16 mg/dL (ref 6–23)
CHLORIDE: 107 meq/L (ref 96–112)
CO2: 24 mmol/L (ref 19–32)
CREATININE: 0.87 mg/dL (ref 0.50–1.10)
Calcium: 8.7 mg/dL (ref 8.4–10.5)
GFR calc Af Amer: >90 mL/min (ref >90)
GFR calc non Af Amer: 90 mL/min — ABNORMAL LOW (ref >90)
Glucose, Bld: 92 mg/dL (ref 70–99)
POTASSIUM: 3.9 mmol/L (ref 3.5–5.1)
Sodium: 138 mmol/L (ref 135–145)

## 2014-10-07 LAB — CBC
HCT: 32.6 % — ABNORMAL LOW (ref 36.0–46.0)
HEMOGLOBIN: 10.2 g/dL — AB (ref 12.0–15.0)
MCH: 23.9 pg — AB (ref 26.0–34.0)
MCHC: 31.3 g/dL (ref 30.0–36.0)
MCV: 76.3 fL — AB (ref 78.0–100.0)
Platelets: 328 10*3/uL (ref 150–400)
RBC: 4.27 MIL/uL (ref 3.87–5.11)
RDW: 21.9 % — ABNORMAL HIGH (ref 11.5–15.5)
WBC: 5.9 10*3/uL (ref 4.0–10.5)

## 2014-10-07 LAB — PREGNANCY, URINE: Preg Test, Ur: NEGATIVE

## 2014-10-07 SURGERY — SALPINGECTOMY, BILATERAL, LAPAROSCOPIC
Anesthesia: General | Laterality: Bilateral

## 2014-10-07 MED ORDER — SUCCINYLCHOLINE CHLORIDE 20 MG/ML IJ SOLN
INTRAMUSCULAR | Status: DC | PRN
  Administered 2014-10-07: 100 mg via INTRAVENOUS

## 2014-10-07 MED ORDER — HYDROMORPHONE HCL 1 MG/ML IJ SOLN
INTRAMUSCULAR | Status: DC | PRN
  Administered 2014-10-07: 1 mg via INTRAVENOUS

## 2014-10-07 MED ORDER — PROPOFOL 10 MG/ML IV EMUL
INTRAVENOUS | Status: AC
  Filled 2014-10-07: qty 20

## 2014-10-07 MED ORDER — BUPIVACAINE HCL (PF) 0.25 % IJ SOLN
INTRAMUSCULAR | Status: AC
  Filled 2014-10-07: qty 30

## 2014-10-07 MED ORDER — LACTATED RINGERS IV SOLN
INTRAVENOUS | Status: DC
  Administered 2014-10-07 (×2): via INTRAVENOUS

## 2014-10-07 MED ORDER — ONDANSETRON HCL 4 MG/2ML IJ SOLN
INTRAMUSCULAR | Status: AC
  Filled 2014-10-07: qty 2

## 2014-10-07 MED ORDER — MIDAZOLAM HCL 2 MG/2ML IJ SOLN
INTRAMUSCULAR | Status: AC
  Filled 2014-10-07: qty 2

## 2014-10-07 MED ORDER — OXYCODONE-ACETAMINOPHEN 5-325 MG PO TABS
1.0000 | ORAL_TABLET | Freq: Once | ORAL | Status: AC
  Administered 2014-10-07: 1 via ORAL

## 2014-10-07 MED ORDER — KETOROLAC TROMETHAMINE 30 MG/ML IJ SOLN
INTRAMUSCULAR | Status: DC | PRN
  Administered 2014-10-07: 30 mg via INTRAVENOUS

## 2014-10-07 MED ORDER — NEOSTIGMINE METHYLSULFATE 10 MG/10ML IV SOLN
INTRAVENOUS | Status: DC | PRN
  Administered 2014-10-07: 5 mg via INTRAVENOUS

## 2014-10-07 MED ORDER — HYDROMORPHONE HCL 1 MG/ML IJ SOLN
INTRAMUSCULAR | Status: AC
Start: 2014-10-07 — End: 2014-10-07
  Filled 2014-10-07: qty 1

## 2014-10-07 MED ORDER — IBUPROFEN 800 MG PO TABS: 800.0000 mg | ORAL_TABLET | Freq: Three times a day (TID) | ORAL | Status: DC | PRN

## 2014-10-07 MED ORDER — LIDOCAINE HCL (CARDIAC) 20 MG/ML IV SOLN
INTRAVENOUS | Status: AC
  Filled 2014-10-07: qty 5

## 2014-10-07 MED ORDER — GLYCOPYRROLATE 0.2 MG/ML IJ SOLN
INTRAMUSCULAR | Status: DC | PRN
  Administered 2014-10-07: .8 mg via INTRAVENOUS

## 2014-10-07 MED ORDER — SUCCINYLCHOLINE CHLORIDE 20 MG/ML IJ SOLN
INTRAMUSCULAR | Status: AC
  Filled 2014-10-07: qty 10

## 2014-10-07 MED ORDER — DEXAMETHASONE SODIUM PHOSPHATE 4 MG/ML IJ SOLN
INTRAMUSCULAR | Status: AC
  Filled 2014-10-07: qty 1

## 2014-10-07 MED ORDER — ONDANSETRON HCL 4 MG/2ML IJ SOLN
INTRAMUSCULAR | Status: DC | PRN
  Administered 2014-10-07: 4 mg via INTRAVENOUS

## 2014-10-07 MED ORDER — ACETAMINOPHEN 160 MG/5ML PO SOLN: 960.0000 mg | Freq: Four times a day (QID) | ORAL | Status: DC | PRN

## 2014-10-07 MED ORDER — DEXAMETHASONE SODIUM PHOSPHATE 4 MG/ML IJ SOLN
INTRAMUSCULAR | Status: DC | PRN
  Administered 2014-10-07: 4 mg via INTRAVENOUS

## 2014-10-07 MED ORDER — SCOPOLAMINE 1 MG/3DAYS TD PT72
1.0000 | MEDICATED_PATCH | Freq: Once | TRANSDERMAL | Status: DC
  Administered 2014-10-07: 1.5 mg via TRANSDERMAL

## 2014-10-07 MED ORDER — PROPOFOL 10 MG/ML IV BOLUS
INTRAVENOUS | Status: DC | PRN
  Administered 2014-10-07: 200 mg via INTRAVENOUS

## 2014-10-07 MED ORDER — SCOPOLAMINE 1 MG/3DAYS TD PT72
MEDICATED_PATCH | TRANSDERMAL | Status: AC
  Administered 2014-10-07: 1.5 mg via TRANSDERMAL
  Filled 2014-10-07: qty 1

## 2014-10-07 MED ORDER — FENTANYL CITRATE 0.05 MG/ML IJ SOLN
INTRAMUSCULAR | Status: AC
  Filled 2014-10-07: qty 5

## 2014-10-07 MED ORDER — LACTATED RINGERS IR SOLN
Status: DC | PRN
  Administered 2014-10-07: 3000 mL

## 2014-10-07 MED ORDER — BUPIVACAINE-EPINEPHRINE 0.25% -1:200000 IJ SOLN
INTRAMUSCULAR | Status: DC | PRN
  Administered 2014-10-07: 28 mL

## 2014-10-07 MED ORDER — OXYCODONE-ACETAMINOPHEN 5-325 MG PO TABS: 1.0000 | ORAL_TABLET | ORAL | Status: DC | PRN

## 2014-10-07 MED ORDER — GLYCOPYRROLATE 0.2 MG/ML IJ SOLN
INTRAMUSCULAR | Status: AC
  Filled 2014-10-07: qty 2

## 2014-10-07 MED ORDER — NEOSTIGMINE METHYLSULFATE 10 MG/10ML IV SOLN
INTRAVENOUS | Status: AC
  Filled 2014-10-07: qty 1

## 2014-10-07 MED ORDER — MIDAZOLAM HCL 2 MG/2ML IJ SOLN
INTRAMUSCULAR | Status: DC | PRN
  Administered 2014-10-07: 2 mg via INTRAVENOUS

## 2014-10-07 MED ORDER — OXYCODONE-ACETAMINOPHEN 5-325 MG PO TABS
ORAL_TABLET | ORAL | Status: AC
  Filled 2014-10-07: qty 1

## 2014-10-07 MED ORDER — ROCURONIUM BROMIDE 100 MG/10ML IV SOLN
INTRAVENOUS | Status: DC | PRN
  Administered 2014-10-07: 10 mg via INTRAVENOUS
  Administered 2014-10-07: 5 mg via INTRAVENOUS
  Administered 2014-10-07: 35 mg via INTRAVENOUS
  Administered 2014-10-07: 10 mg via INTRAVENOUS

## 2014-10-07 MED ORDER — BUPIVACAINE-EPINEPHRINE (PF) 0.25% -1:200000 IJ SOLN
INTRAMUSCULAR | Status: AC
Start: 2014-10-07 — End: 2014-10-07
  Filled 2014-10-07: qty 30

## 2014-10-07 MED ORDER — FENTANYL CITRATE 0.05 MG/ML IJ SOLN
INTRAMUSCULAR | Status: DC | PRN
  Administered 2014-10-07 (×2): 100 ug via INTRAVENOUS
  Administered 2014-10-07: 50 ug via INTRAVENOUS

## 2014-10-07 MED ORDER — LIDOCAINE HCL (CARDIAC) 20 MG/ML IV SOLN
INTRAVENOUS | Status: DC | PRN
  Administered 2014-10-07: 70 mg via INTRAVENOUS

## 2014-10-07 SURGICAL SUPPLY — 34 items
BLADE SURG 11 STRL SS (BLADE) ×3 IMPLANT
CABLE HIGH FREQUENCY MONO STRZ (ELECTRODE) IMPLANT
CATH ROBINSON RED A/P 16FR (CATHETERS) IMPLANT
CHLORAPREP W/TINT 26ML (MISCELLANEOUS) ×3 IMPLANT
CLOTH BEACON ORANGE TIMEOUT ST (SAFETY) ×3 IMPLANT
DECANTER SPIKE VIAL GLASS SM (MISCELLANEOUS) ×3 IMPLANT
DRSG COVADERM PLUS 2X2 (GAUZE/BANDAGES/DRESSINGS) ×3 IMPLANT
DRSG OPSITE POSTOP 3X4 (GAUZE/BANDAGES/DRESSINGS) ×3 IMPLANT
GLOVE BIOGEL PI IND STRL 7.0 (GLOVE) ×1 IMPLANT
GLOVE BIOGEL PI INDICATOR 7.0 (GLOVE) ×2
GLOVE SURG SS PI 6.5 STRL IVOR (GLOVE) ×3 IMPLANT
GOWN STRL REUS W/TWL LRG LVL3 (GOWN DISPOSABLE) ×9 IMPLANT
LIGASURE 5MM LAPAROSCOPIC (INSTRUMENTS) ×3 IMPLANT
LIGASURE LAP ATLAS 10MM 37CM (INSTRUMENTS) IMPLANT
LIQUID BAND (GAUZE/BANDAGES/DRESSINGS) ×3 IMPLANT
NEEDLE INSUFFLATION 120MM (ENDOMECHANICALS) ×3 IMPLANT
NS IRRIG 1000ML POUR BTL (IV SOLUTION) ×3 IMPLANT
PACK LAPAROSCOPY BASIN (CUSTOM PROCEDURE TRAY) ×3 IMPLANT
PAD TRENDELENBURG OR TABLE (MISCELLANEOUS) ×3 IMPLANT
POUCH SPECIMEN RETRIEVAL 10MM (ENDOMECHANICALS) ×3 IMPLANT
PROTECTOR NERVE ULNAR (MISCELLANEOUS) ×3 IMPLANT
SET IRRIG TUBING LAPAROSCOPIC (IRRIGATION / IRRIGATOR) ×3 IMPLANT
SLEEVE XCEL OPT CAN 5 100 (ENDOMECHANICALS) IMPLANT
SOLUTION ELECTROLUBE (MISCELLANEOUS) ×3 IMPLANT
SUT MNCRL AB 4-0 PS2 18 (SUTURE) ×3 IMPLANT
SUT VICRYL 0 UR6 27IN ABS (SUTURE) ×6 IMPLANT
TOWEL OR 17X24 6PK STRL BLUE (TOWEL DISPOSABLE) ×6 IMPLANT
TRAY FOLEY CATH 14FR (SET/KITS/TRAYS/PACK) ×3 IMPLANT
TROCAR HASSON GELL 12X100 (TROCAR) ×3 IMPLANT
TROCAR XCEL NON-BLD 11X100MML (ENDOMECHANICALS) IMPLANT
TROCAR XCEL NON-BLD 5MMX100MML (ENDOMECHANICALS) ×3 IMPLANT
TROCAR XCEL OPT SLVE 5M 100M (ENDOMECHANICALS) ×6 IMPLANT
WARMER LAPAROSCOPE (MISCELLANEOUS) ×3 IMPLANT
WATER STERILE IRR 1000ML POUR (IV SOLUTION) ×3 IMPLANT

## 2014-10-07 NOTE — H&P (Addendum)
Robin Hodge is an 28 y.o. female. P4.  She desires permanent sterilization and also desires to decrease her future risk of ovarian and fallopian tube cancer.  She desires laparoscopic bilateral salpingectomy.    Pertinent Gynecological History: Menses: Irregular spotting delivery, received Depoprovera when discharged from the hospital after recent delivery in November 2015. DES exposure: unknown Blood transfusions: In last admission at the hospital in November 2015 after delivery. Sexually transmitted diseases: no past history Previous GYN Procedures: D & C  Last pap: normal      Past Medical History  Diagnosis Date  . Abnormal Pap smear     colpo-ok since  . Obese   . Anemia   . Heart murmur     pt denies  . Anxiety   . Depression   . GERD (gastroesophageal reflux disease)     with pregnancy    Past Surgical History  Procedure Laterality Date  . Dilation and curettage of uterus      Family History  Problem Relation Age of Onset  . Hypertension Mother   . Diabetes Mother   . Heart disease Mother   . Heart disease Father   . Other Neg Hx     Social History:  reports that she quit smoking about a year ago. Her smoking use included Cigarettes. She has a .75 pack-year smoking history. She has never used smokeless tobacco. She reports that she drinks alcohol. She reports that she does not use illicit drugs.  Allergies: No Known Allergies  Prescriptions prior to admission  Medication Sig Dispense Refill Last Dose  . sertraline (ZOLOFT) 25 MG tablet Take 25 mg by mouth daily.     . ferrous sulfate 325 (65 FE) MG tablet Take 325 mg by mouth daily.   08/10/2014 at Unknown time  . ibuprofen (ADVIL,MOTRIN) 600 MG tablet Take 1 tablet (600 mg total) by mouth every 6 (six) hours as needed. 30 tablet 2   . NIFEdipine (PROCARDIA-XL/ADALAT CC) 30 MG 24 hr tablet Take 1 tablet (30 mg total) by mouth daily. 60 tablet 1   . oxyCODONE-acetaminophen (PERCOCET/ROXICET) 5-325 MG per tablet  Take 1-2 tablets by mouth every 4 (four) hours as needed for severe pain. 30 tablet 0     ROS  Blood pressure 147/97, pulse 72, temperature 99 F (37.2 C), temperature source Oral, resp. rate 20, height 5\' 7"  (1.702 m), weight 265 lb (120.203 kg), SpO2 100 %, unknown if currently breastfeeding. Physical Exam  Gen: NAD.  CVS:  S1. S2. RRR.  Pulm: CTAB Abd: Soft, nontender, obese Ext: WWP, no edema, no calf tenderness.  CBC    Component Value Date/Time   WBC 5.9 10/07/2014 1237   RBC 4.27 10/07/2014 1237   HGB 10.2* 10/07/2014 1237   HCT 32.6* 10/07/2014 1237   PLT 328 10/07/2014 1237   MCV 76.3* 10/07/2014 1237   MCH 23.9* 10/07/2014 1237   MCHC 31.3 10/07/2014 1237   RDW 21.9* 10/07/2014 1237   LYMPHSABS 1.4 08/06/2014 0740   MONOABS 1.2* 08/06/2014 0740   EOSABS 0.1 08/06/2014 0740   BASOSABS 0.0 08/06/2014 0740   CMP     Component Value Date/Time   NA 140 08/13/2014 1400   K 4.3 08/13/2014 1400   CL 103 08/13/2014 1400   CO2 24 08/13/2014 1400   GLUCOSE 91 08/13/2014 1400   BUN 9 08/13/2014 1400   CREATININE 0.72 08/13/2014 1400   CALCIUM 8.4 08/13/2014 1400   PROT 6.1 08/13/2014 1400   ALBUMIN  2.3* 08/13/2014 1400   AST 24 08/13/2014 1400   ALT 13 08/13/2014 1400   ALKPHOS 112 08/13/2014 1400   BILITOT <0.2* 08/13/2014 1400   GFRNONAA >90 08/13/2014 1400   GFRAA >90 08/13/2014 1400   Urine pregnancy test: Negative.   Assessment/Plan: 28 y/o  P4 desiring permanent sterilization by laparoscopic bilateral salpingectomy.  We discussed risks, benefits and alternatives of the procedure including risks of sterilization regret, bleeding, infection and damage to organs.  All questions were answered and she was consented for the procedure.  We also discussed risks of conversion to laparotomy.  She did not not desire Essure sterilization.     [facility],Kit Mol[facility] 10/07/2014, 12:53 PM

## 2014-10-07 NOTE — Brief Op Note (Signed)
10/07/2014  4:43 PM  PATIENT:  Robin Hodge  28 y.o. female  PRE-OPERATIVE DIAGNOSIS:   desires Surgical Sterilization  POST-OPERATIVE DIAGNOSIS:   desires Surgical Sterilization  PROCEDURE:  Procedure(s): LAPAROSCOPIC BILATERAL SALPINGECTOMY (Bilateral)  SURGEON:  Surgeon(s) and Role:    * Damoni Erker Alger SimonsWakuru Samyukta Cura, MD - Primary  ASSISTANTS: Scrub Tech:  Crystal.      ANESTHESIA:   general  EBL:  Total I/O In: 1800 [I.V.:1800] Out: 555 [Urine:550; Blood:5]  BLOOD ADMINISTERED:none  DRAINS: none   LOCAL MEDICATIONS USED:  BUPIVICAINE with epinephrine  SPECIMEN:  Source of Specimen:  Left and right fallopian tubes.  DISPOSITION OF SPECIMEN:  PATHOLOGY  COUNTS:  YES  TOURNIQUET:  * No tourniquets in log *  DICTATION: .Dragon Dictation  PLAN OF CARE: Discharge to home after PACU  PATIENT DISPOSITION:  PACU - hemodynamically stable.   Delay start of Pharmacological VTE agent (>24hrs) due to surgical blood loss or risk of bleeding: not applicable

## 2014-10-07 NOTE — Discharge Instructions (Signed)
DISCHARGE INSTRUCTIONS: Laparoscopy  Can take ibuprofen/motrin/advil at 10PM Can use heating pad to abd and shoulders Increase water next 48hours The following instructions have been prepared to help you care for yourself upon your return home today.  Wound care:  Do not get the incision wet for the first 24 hours. The incision should be kept clean and dry.  The Band-Aids or dressings may be removed the day after surgery.  Should the incision become sore, red, and swollen after the first week, check with your doctor.  Personal hygiene:  Shower the day after your procedure.  Activity and limitations:  Do NOT drive or operate any equipment today.  Do NOT lift anything more than 15 pounds for 2-3 weeks after surgery.  Do NOT rest in bed all day.  Walking is encouraged. Walk each day, starting slowly with 5-minute walks 3 or 4 times a day. Slowly increase the length of your walks.  Walk up and down stairs slowly.  Do NOT do strenuous activities, such as golfing, playing tennis, bowling, running, biking, weight lifting, gardening, mowing, or vacuuming for 2-4 weeks. Ask your doctor when it is okay to start.  Diet: Eat a light meal as desired this evening. You may resume your usual diet tomorrow.  Return to work: This is dependent on the type of work you do. For the most part you can return to a desk job within a week of surgery. If you are more active at work, please discuss this with your doctor.  What to expect after your surgery: You may have a slight burning sensation when you urinate on the first day. You may have a very small amount of blood in the urine. Expect to have a small amount of vaginal discharge/light bleeding for 1-2 weeks. It is not unusual to have abdominal soreness and bruising for up to 2 weeks. You may be tired and need more rest for about 1 week. You may experience shoulder pain for 24-72 hours. Lying flat in bed may relieve it.  Call your doctor for any of  the following:  Develop a fever of 100.4 or greater  Inability to urinate 6 hours after discharge from hospital  Severe pain not relieved by pain medications  Persistent of heavy bleeding at incision site  Redness or swelling around incision site after a week  Increasing nausea or vomiting  Patient Signature________________________________________ Nurse Signature_________________________________________

## 2014-10-07 NOTE — Op Note (Signed)
  Robin Hodge, Robin H.  DOB 1985-12-08.  PREOP DIAGNOSIS: Multiparous patient desiring permanent sterilization and also desiring to decrease her future risk of fallopian tube and ovarian cancer  POSTOP DIAGNOSIS: Same as above  PROCEDURE: Laparoscopic bilateral salpingectomy  SURGEON: Dr. Hoover BrownsEma Nakira Litzau  ASSISTANT: Scrub tech: Crystal  ANESTHESIA: General.  COMPLICATIONS: None  EBL: 5 mL  IV FLUID: 1800 mL LR  URINE OUTPUT: 600 mL  FINDINGS: Normal uterus. Normal fallopian tubes bilaterally. Normal right ovary. Left ovary with a 3 cm simple cyst with clear fluid within cyst  PROCEDURE:   Informed consent was obtained from the patient to undergo the procedure after discussing the risks benefits and alternatives of the procedure. She was taken to the operating room where anesthesia was administered without difficulty. Her right arm was tucked and she was placed in the dorsal lithotomy position. She was prepped abdominally and also vaginally and perineum in the usual sterile fashion. Foley catheter was placed in the urethra and a gauze pad on a ring forcep was placed in the vagina for uterus manipulation.    Attention was then turned to the abdomen where 0.25% marcaine with epinephrine was instilled in the infraumbilical area. The skin was incised with a scalpel, about 15mm incision, and and entry into the abdomen was made through the subcutaneous fascia and peritoneal layers using Allis clamps, hemostats and S retractors for retraction. The fascia was tagged with 0 Vicryl in a pursestring suture. The Hasson trocar was placed in and 20 mL air instilled into the bulb. The abdomen was insufflated with gas. The scope was then placed in and the pelvis was visualized. Two 5 mm ports were placed using the Excel trocar on the right lower abdomen under direct visualization. The right fallopian tube was then separated from the mesosalpinx and uterine cornua using the 5mm LigaSure.  It was then placed in the  anterior pelvic cul de sac. The left fallopian tube was similarly removed using the LigaSure. The specimens were then placed into the Endo Catch bag and delivered through the infraumbilical incision. The pelvis was inspected and  was noted to be hemostatic. All instruments were removed under direct visualization and hemostasis was assured at port sites.    The Umbilical incision was closed using 0 Vicryl pursestring that had been placed before. The skin was closed with 4-0 Monocryl. The 2 side ports incisions were also closed with 4-0 Monocryl. Dermabond and Tegaderm dressings were placed. The Foley catheter and the 4 x 4 gauze on the ring forceps were then removed. The patient was then awoken from anesthesia and she was taken to recovery room in stable condition  SPECIMEN: Portions of left and right fallopian tubes

## 2014-10-07 NOTE — Anesthesia Procedure Notes (Signed)
Procedure Name: Intubation Date/Time: 10/07/2014 2:11 PM Performed by: Elbert EwingsHYMER, Priscille Shadduck S Pre-anesthesia Checklist: Patient identified, Emergency Drugs available, Suction available and Patient being monitored Patient Re-evaluated:Patient Re-evaluated prior to inductionOxygen Delivery Method: Circle system utilized Preoxygenation: Pre-oxygenation with 100% oxygen Intubation Type: IV induction Ventilation: Mask ventilation without difficulty Grade View: Grade I Tube type: Oral Number of attempts: 1 Secured at: 22 cm Tube secured with: Tape Dental Injury: Teeth and Oropharynx as per pre-operative assessment

## 2014-10-07 NOTE — Anesthesia Postprocedure Evaluation (Signed)
  Anesthesia Post-op Note  Patient: Robin Hodge  Procedure(s) Performed: Procedure(s): LAPAROSCOPIC BILATERAL SALPINGECTOMY (Bilateral)  Patient Location: PACU  Anesthesia Type:General  Level of Consciousness: awake, alert  and oriented  Airway and Oxygen Therapy: Patient Spontanous Breathing  Post-op Pain: mild  Post-op Assessment: Post-op Vital signs reviewed, Patient's Cardiovascular Status Stable, Respiratory Function Stable, Patent Airway, No signs of Nausea or vomiting, Adequate PO intake and Pain level controlled  Post-op Vital Signs: Reviewed and stable  Last Vitals:  Filed Vitals:   10/07/14 1730  BP: 142/80  Pulse: 72  Temp:   Resp: 16    Complications: No apparent anesthesia complications

## 2014-10-07 NOTE — Transfer of Care (Signed)
Immediate Anesthesia Transfer of Care Note  Patient: Robin Hodge  Procedure(s) Performed: Procedure(s): LAPAROSCOPIC BILATERAL SALPINGECTOMY (Bilateral)  Patient Location: PACU  Anesthesia Type:General  Level of Consciousness: awake, alert  and oriented  Airway & Oxygen Therapy: Patient Spontanous Breathing and Patient connected to nasal cannula oxygen  Post-op Assessment: Report given to PACU RN and Post -op Vital signs reviewed and stable  Post vital signs: Reviewed and stable  Complications: No apparent anesthesia complications

## 2014-10-08 ENCOUNTER — Encounter (HOSPITAL_COMMUNITY): Payer: Self-pay | Admitting: Obstetrics & Gynecology

## 2014-10-22 ENCOUNTER — Encounter (HOSPITAL_COMMUNITY): Payer: Self-pay | Admitting: Obstetrics and Gynecology

## 2015-02-18 ENCOUNTER — Emergency Department (HOSPITAL_COMMUNITY)
Admission: EM | Admit: 2015-02-18 | Discharge: 2015-02-18 | Disposition: A | Discharge status: Home / Self Care | Payer: Medicaid Other | Source: Home / Self Care | Attending: Family Medicine | Admitting: Family Medicine

## 2015-02-18 ENCOUNTER — Encounter (HOSPITAL_COMMUNITY): Payer: Self-pay | Admitting: Emergency Medicine

## 2015-02-18 DIAGNOSIS — G5621 Lesion of ulnar nerve, right upper limb: Secondary | ICD-10-CM | POA: Diagnosis not present

## 2015-02-18 MED ORDER — PREDNISONE 10 MG (48) PO TBPK: ORAL_TABLET | Freq: Every day | ORAL | Status: DC

## 2015-02-18 NOTE — ED Provider Notes (Signed)
Robin Hodge is a 29 y.o. female who presents to Urgent Care today for right arm pain present for one week. Patient has pain radiating from her medial elbow down to her ulnar fingers. The pain is worse when she extends her elbow fully. She denies any weakness or numbness. No fevers or chills nausea vomiting or diarrhea. 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   Past Medical History  Diagnosis Date  . Abnormal Pap smear     colpo-ok since  . Obese   . Anemia   . Heart murmur     pt denies  . Anxiety   . Depression   . GERD (gastroesophageal reflux disease)     with pregnancy   Past Surgical History  Procedure Laterality Date  . Dilation and curettage of uterus    . Laparoscopic bilateral salpingectomy Bilateral 10/07/2014    Procedure: LAPAROSCOPIC BILATERAL SALPINGECTOMY;  Surgeon: Konrad FelixEma Wakuru Kulwa, MD;  Location: [facility];  Service: Gynecology;  Laterality: Bilateral;   History  Substance Use Topics  . Smoking status: Former Smoker -- 0.25 packs/day for 3 years    Types: Cigarettes    Quit date: 09/25/2013  . Smokeless tobacco: Never Used  . Alcohol Use: Yes     Comment: occasionally   ROS as above Medications: No current facility-administered medications for this encounter.   Current Outpatient Prescriptions  Medication Sig Dispense Refill  . ferrous sulfate 325 (65 FE) MG tablet Take 325 mg by mouth daily.    Marland Kitchen. ibuprofen (ADVIL,MOTRIN) 800 MG tablet Take 1 tablet (800 mg total) by mouth every 8 (eight) hours as needed for mild pain or moderate pain. 30 tablet 0  . NIFEdipine (PROCARDIA-XL/ADALAT CC) 30 MG 24 hr tablet Take 1 tablet (30 mg total) by mouth daily. 60 tablet 1  . oxyCODONE-acetaminophen (PERCOCET/ROXICET) 5-325 MG per tablet Take 1-2 tablets by mouth every 4 (four) hours as needed for severe pain. 30 tablet 0  . oxyCODONE-acetaminophen (ROXICET) 5-325 MG per tablet Take 1 tablet by mouth every 4 (four) hours as needed for severe pain. 30  tablet 0  . predniSONE (STERAPRED UNI-PAK 48 TAB) 10 MG (48) TBPK tablet Take by mouth daily. 48 tablet 0  . sertraline (ZOLOFT) 25 MG tablet Take 25 mg by mouth daily.     No Known Allergies   Exam:  BP 141/84 mmHg  Pulse 120  Temp(Src) 97.9 F (36.6 C) (Oral)  Resp 18  SpO2 100%  LMP 01/20/2015 Gen: Well NAD HEENT: EOMI,  MMM Lungs: Normal work of breathing. CTABL Exts: Brisk capillary refill, warm and well perfused.  Right arm shoulder nontender normal motion Elbow normal-appearing range of motion lacks full extension by 5. Positive Tinel's test at the cubital tunnel. Wrist pulses capillary refill and sensation intact normal grip strength sensation and motion.  No results found for this or any previous visit (from the past 24 hour(s)). No results found.  Assessment and Plan: 29 y.o. 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 Treat with prednisone Dosepak. Follow up with sports medicine.  Discussed warning signs or symptoms. Please see discharge instructions. Patient expresses understanding.     Rodolph BongEvan S Jakobie Henslee, MD 02/18/15 [phone]

## 2015-02-18 NOTE — ED Notes (Signed)
C/o right arm pain x 1 wk.  No relief with otc meds.  Denies injury.  Pain felt with extending arm.

## 2015-02-18 NOTE — Discharge Instructions (Signed)
Thank you for coming in today.   Cubital Tunnel Syndrome (Ulnar Neuritis) Cubital tunnel syndrome is a disorder of the nervous system of the elbow and upper arm the causes pain, tingling, weakness of the hand, or the loss of feeling in the ring and little fingers. The disorder is caused by a compression or stretching of the ulnar nerve at the elbow and in the forearm by muscles or ligament-like tissues. The ulnar nerve is susceptible to injury because it has little tissue that protects it at the elbow. The lack of protection increases the risk of injury. Cubital tunnel syndrome may decrease athletic performance in sports that require strong hand or wrist actions (tennis or racquetball).  SYMPTOMS   Clumsiness or weakness of the hand.  Poor dexterity (fine hand function).  Tenderness of the inner elbow.  Aching or soreness of the inner elbow.  Increased pain with forced full-elbow bending.  Reduced control with throwing, such as pitching.  Tingling, numbness, or burning inside the forearm or in part of the hand or fingers (especially the little finger or ring finger).  Sharp pains that shoot from the elbow down to the wrist and hand.  A weak grip, especially power grip, and a weak pinch.  Reduced performance in sports that require a strong grip. CAUSES   Increased pressure on the ulnar nerve at the elbow, arm, or forearm caused by swollen, inflamed, or scarred tissues; ligament-like; or between muscles.  Stretching of the nerve due to loose elbow ligaments.  Trauma to the nerve at the elbow.  Repetitive elbow bending. RISK INCREASES WITH:  Poor strength or flexibility.  Inadequate warm-up properly physical activity.  Diabetes mellitus.  under-active thyroid gland(hypothyroidism).  Repetitive and/or strenuous throwing motions such as baseball and javelin throwing.  Contact sports (football, soccer, rugby, or lacrosse).  Other elbow conditions (medial epicondylitis or  loose inner elbow ligaments). PREVENTION  Warm up and stretch properly before activity.  Maintain physical fitness:  Wrist, forearm, and elbow flexibility.  Muscle strength and endurance.  Cardiovascular fitness.  Wear proper protective equipment, including elbow pads.  Learn and use proper throwing techniques. PROGNOSIS  Cubital tunnel syndrome is typically curable is treated appropriately. Is some cases the condition may heal without treatment. If the muscle begins to waste or the nerve damage worsens, surgery may be necessary. RELATED COMPLICATIONS   Permanent numbness and weakness of the ring and little fingers.  Weak grip.  Permanent paralysis of some hand and finger muscles.  Risks associated with surgery, including infection, bleeding, injury to nerves (including the ulnar nerve), recurrent or continued symptoms, and elbow stiffness. TREATMENT  Treatment initially involves stopping the activities that cause the symptoms to worsen. Medications and ice can be used to reduce pain in inflammation. Splinting and protecting the elbow with padding (especially at night) to prevent full bending of the elbow may help. Stretching and strengthening exercises of the muscles of the forearm and elbow are important, and they may be performed at home or with the assistance of a therapist. If conservative treatment is not successful, surgery may be necessary to reduce compression of the nerve. Before return to sport, assessment for proper throwing and hitting mechanics is important.  MEDICATION   If pain medication is necessary, nonsteroidal anti-inflammatory medications, such as aspirin and ibuprofen, or other minor pain relievers, such as acetaminophen, are often recommended. Contact your caregiver immediately if any bleeding, stomach upset, or signs of an allergic reaction occur.  Prescription pain relievers are usually only  prescribed after surgery. Use only as directed and only as much as  you need. COLD THERAPY   Cold treatment (icing) relieves pain and reduces inflammation. Cold treatment should be applied for 10 to 15 minutes every 2 to 3 hours for inflammation and pain and immediately after any activity that aggravates your symptoms. Use ice packs or an ice massage. SEEK MEDICAL CARE IF:   Symptoms get worse or do not improve in 2 weeks despite treatment.  You experience pain, numbness, or coldness in the hand.  Blue, gray, or dark color appears in the fingernails.  Any of the following occur after surgery: increased pain, swelling, redness, drainage, or bleeding in the surgical area or signs of infection.  New, unexplained symptoms develop (drugs used in treatment may produce side effects). Document Released: 09/25/2005 Document Revised: 12/18/2011 Document Reviewed: 01/07/2009 [facility] Patient Information 2015 [place]. This information is not intended to replace advice given to you by your health care provider. Make sure you discuss any questions you have with your health care provider.

## 2015-03-18 ENCOUNTER — Encounter (HOSPITAL_COMMUNITY): Payer: Self-pay | Admitting: Obstetrics and Gynecology

## 2015-04-09 ENCOUNTER — Encounter (HOSPITAL_COMMUNITY): Payer: Self-pay | Admitting: Emergency Medicine

## 2015-04-09 ENCOUNTER — Emergency Department (HOSPITAL_COMMUNITY)
Admission: EM | Admit: 2015-04-09 | Discharge: 2015-04-09 | Disposition: A | Discharge status: Home / Self Care | Payer: Medicaid Other | Source: Home / Self Care

## 2015-04-09 DIAGNOSIS — K047 Periapical abscess without sinus: Secondary | ICD-10-CM

## 2015-04-09 DIAGNOSIS — K088 Other specified disorders of teeth and supporting structures: Secondary | ICD-10-CM | POA: Diagnosis not present

## 2015-04-09 DIAGNOSIS — K0889 Other specified disorders of teeth and supporting structures: Secondary | ICD-10-CM

## 2015-04-09 MED ORDER — AMOXICILLIN-POT CLAVULANATE 875-125 MG PO TABS: 1.0000 | ORAL_TABLET | Freq: Two times a day (BID) | ORAL | Status: DC

## 2015-04-09 MED ORDER — DICLOFENAC SODIUM 75 MG PO TBEC: DELAYED_RELEASE_TABLET | ORAL | Status: DC

## 2015-04-09 MED ORDER — HYDROCODONE-ACETAMINOPHEN 5-325 MG PO TABS: 1.0000 | ORAL_TABLET | Freq: Four times a day (QID) | ORAL | Status: DC | PRN

## 2015-04-09 NOTE — ED Provider Notes (Signed)
CSN: [id number]     Arrival date & time 04/09/15  1642 History   None    Chief Complaint  Patient presents with  . Dental Pain   (Consider location/radiation/quality/duration/timing/severity/associated sxs/prior Treatment) HPI Comments: Robin Hodge presents today with right sided upper tooth pain. Onset 4-5 days and worsening. Has an appt with Dentist next week. She feels there may be drainage. Denies fever or chills. Bad odor and taste to her mouth  Patient is a 29 y.o. female presenting with tooth pain. The history is provided by the patient.  Dental Pain   Past Medical History  Diagnosis Date  . Abnormal Pap smear     colpo-ok since  . Obese   . Anemia   . Heart murmur     pt denies  . Anxiety   . Depression   . GERD (gastroesophageal reflux disease)     with pregnancy   Past Surgical History  Procedure Laterality Date  . Dilation and curettage of uterus    . Laparoscopic bilateral salpingectomy Bilateral 10/07/2014    Procedure: LAPAROSCOPIC BILATERAL SALPINGECTOMY;  Surgeon: Konrad Felix, MD;  Location: [facility];  Service: Gynecology;  Laterality: Bilateral;   Family History  Problem Relation Age of Onset  . Hypertension Mother   . Diabetes Mother   . Heart disease Mother   . Heart disease Father   . Other Neg Hx    History  Substance Use Topics  . Smoking status: Former Smoker -- 0.25 packs/day for 3 years    Types: Cigarettes    Quit date: 09/25/2013  . Smokeless tobacco: Never Used  . Alcohol Use: Yes     Comment: occasionally   OB History    Gravida Para Term Preterm AB TAB SAB Ectopic Multiple Living   0 2  0 4     Review of Systems  All other systems reviewed and are negative.   Allergies  Review of patient's allergies indicates no known allergies.  Home Medications   Prior to Admission medications   Medication Sig Start Date End Date Taking? Authorizing Provider  amoxicillin-clavulanate (AUGMENTIN) 875-125 MG per tablet Take 1  tablet by mouth every 12 (twelve) hours. 04/09/15   Riki Sheer, PA-C  diclofenac (VOLTAREN) 75 MG EC tablet 1 tablet every 12 hours for pain and inflammation 04/09/15   Riki Sheer, PA-C  ferrous sulfate 325 (65 FE) MG tablet Take 325 mg by mouth daily.    Historical Provider, MD  HYDROcodone-acetaminophen (NORCO/VICODIN) 5-325 MG per tablet Take 1-2 tablets by mouth every 6 (six) hours as needed for moderate pain. 04/09/15   Riki Sheer, PA-C  ibuprofen (ADVIL,MOTRIN) 800 MG tablet Take 1 tablet (800 mg total) by mouth every 8 (eight) hours as needed for mild pain or moderate pain. 10/07/14   Hoover Browns, MD  NIFEdipine (PROCARDIA-XL/ADALAT CC) 30 MG 24 hr tablet Take 1 tablet (30 mg total) by mouth daily. 08/13/14   Venus Standard, CNM  oxyCODONE-acetaminophen (PERCOCET/ROXICET) 5-325 MG per tablet Take 1-2 tablets by mouth every 4 (four) hours as needed for severe pain. 08/13/14   Nigel Bridgeman, CNM  oxyCODONE-acetaminophen (ROXICET) 5-325 MG per tablet Take 1 tablet by mouth every 4 (four) hours as needed for severe pain. 10/07/14   Hoover Browns, MD  predniSONE (STERAPRED UNI-PAK 48 TAB) 10 MG (48) TBPK tablet Take by mouth daily. 02/18/15   Rodolph Bong, MD  sertraline (ZOLOFT) 25 MG tablet Take 25 mg  by mouth daily.    Historical Provider, MD   BP 154/97 mmHg  Pulse 95  Temp(Src) 99.3 F (37.4 C) (Oral)  Resp 16  SpO2 100%  LMP 03/21/2015 Physical Exam  Constitutional: She appears well-developed and well-nourished. No distress.  HENT:  Head: Normocephalic and atraumatic.  Right upper molar with enlarged gum, erythema and tender to palpation. No frank pus noted.   Neck: Neck supple.  Right sided cervical adenopathy, mildly tender to palpation  Pulmonary/Chest: Effort normal.  Skin: Skin is warm and dry. She is not diaphoretic.  Psychiatric: Her behavior is normal.  Nursing note and vitals reviewed.   ED Course  Procedures (including critical care time) Labs Review Labs  Reviewed - No data to display  Imaging Review No results found.   MDM   1. Tooth abscess   2. Pain, dental    No frank abscess or drainage, though probable underlying given presentation of gum and tooth. Treat with antibiotics and NSAIDs. Ok for [employer] for breakthrough pain. If develops worsening pain or systemic signs of infection, please return to the ER, otherwise please f/u with Dentist.     Riki SheerMichelle G Ragnar Waas, PA-C 04/09/15 1737

## 2015-04-09 NOTE — ED Notes (Signed)
C/o dental pain States right top tooth hurts Does have appt with dentist next week States abscess is giving her breath odor Ibuprofen and tylenol was used as tx

## 2015-05-31 ENCOUNTER — Encounter (HOSPITAL_COMMUNITY): Payer: Self-pay

## 2015-05-31 ENCOUNTER — Emergency Department (HOSPITAL_COMMUNITY)
Admission: EM | Admit: 2015-05-31 | Discharge: 2015-05-31 | Disposition: A | Discharge status: Home / Self Care | Payer: Medicaid Other | Attending: Emergency Medicine | Admitting: Emergency Medicine

## 2015-05-31 DIAGNOSIS — Z87891 Personal history of nicotine dependence: Secondary | ICD-10-CM | POA: Insufficient documentation

## 2015-05-31 DIAGNOSIS — D649 Anemia, unspecified: Secondary | ICD-10-CM | POA: Insufficient documentation

## 2015-05-31 DIAGNOSIS — K088 Other specified disorders of teeth and supporting structures: Secondary | ICD-10-CM | POA: Insufficient documentation

## 2015-05-31 DIAGNOSIS — R011 Cardiac murmur, unspecified: Secondary | ICD-10-CM | POA: Insufficient documentation

## 2015-05-31 DIAGNOSIS — F419 Anxiety disorder, unspecified: Secondary | ICD-10-CM | POA: Diagnosis not present

## 2015-05-31 DIAGNOSIS — F329 Major depressive disorder, single episode, unspecified: Secondary | ICD-10-CM | POA: Diagnosis not present

## 2015-05-31 DIAGNOSIS — Z79899 Other long term (current) drug therapy: Secondary | ICD-10-CM | POA: Insufficient documentation

## 2015-05-31 DIAGNOSIS — K219 Gastro-esophageal reflux disease without esophagitis: Secondary | ICD-10-CM | POA: Insufficient documentation

## 2015-05-31 DIAGNOSIS — E669 Obesity, unspecified: Secondary | ICD-10-CM | POA: Diagnosis not present

## 2015-05-31 DIAGNOSIS — K0889 Other specified disorders of teeth and supporting structures: Secondary | ICD-10-CM

## 2015-05-31 MED ORDER — ACETAMINOPHEN 325 MG PO TABS
325.0000 mg | ORAL_TABLET | Freq: Once | ORAL | Status: AC
  Administered 2015-05-31: 325 mg via ORAL
  Filled 2015-05-31: qty 1

## 2015-05-31 MED ORDER — PENICILLIN V POTASSIUM 500 MG PO TABS: 500.0000 mg | ORAL_TABLET | Freq: Four times a day (QID) | ORAL | Status: AC

## 2015-05-31 NOTE — Discharge Instructions (Signed)
Dental Pain A tooth ache may be caused by cavities (tooth decay). Cavities expose the nerve of the tooth to air and hot or cold temperatures. It may come from an infection or abscess (also called a boil or furuncle) around your tooth. It is also often caused by dental caries (tooth decay). This causes the pain you are having. DIAGNOSIS  Your caregiver can diagnose this problem by exam. TREATMENT   If caused by an infection, it may be treated with medications which kill germs (antibiotics) and pain medications as prescribed by your caregiver. Take medications as directed.  Only take over-the-counter or prescription medicines for pain, discomfort, or fever as directed by your caregiver.  Whether the tooth ache today is caused by infection or dental disease, you should see your dentist as soon as possible for further care. SEEK MEDICAL CARE IF: The exam and treatment you received today has been provided on an emergency basis only. This is not a substitute for complete medical or dental care. If your problem worsens or new problems (symptoms) appear, and you are unable to meet with your dentist, call or return to this location. SEEK IMMEDIATE MEDICAL CARE IF:   You have a fever.  You develop redness and swelling of your face, jaw, or neck.  You are unable to open your mouth.  You have severe pain uncontrolled by pain medicine. MAKE SURE YOU:   Understand these instructions.  Will watch your condition.  Will get help right away if you are not doing well or get worse. Document Released: 09/25/2005 Document Revised: 12/18/2011 Document Reviewed: 05/13/2008 [facility] Patient Information 2015 [place], [place]. This information is not intended to replace advice given to you by your health care provider. Make sure you discuss any questions you have with your health care provider.  Please use antibiotics as directed, please monitor for new or worsening signs or symptoms. Please follow-up with the  dentist for further evaluation and management.

## 2015-05-31 NOTE — ED Provider Notes (Signed)
CSN: [id number]     Arrival date & time 05/31/15  2036 History  This chart was scribed for Robin Mechanic, PA-C, working with Robin Mulders, MD by Robin Hodge, ED Scribe. This patient was seen in room TR10C/TR10C and the patient's care was started at 9:52 PM.   Chief Complaint  Patient presents with  . Dental Pain   The history is provided by the patient. No language interpreter was used.   HPI Comments: Robin Hodge is a 29 y.o. female who presents to the Emergency Department complaining of constant, moderately, improving right upper dental pain onset two days. The patient reports a hx of dental abscess and states this episode feels similar.  Per patient, there was some drainage from the area of complaint and subsequent decrease in swelling while waiting to be seen in the ED.  She has taken Tylenol for this complaint.  She denies trouble swallowing.  Denies fever chills, n/v headache, difficulty opening or closing jaw, neck pain.   Past Medical History  Diagnosis Date  . Abnormal Pap smear     colpo-ok since  . Obese   . Anemia   . Heart murmur     pt denies  . Anxiety   . Depression   . GERD (gastroesophageal reflux disease)     with pregnancy   Past Surgical History  Procedure Laterality Date  . Dilation and curettage of uterus    . Laparoscopic bilateral salpingectomy Bilateral 10/07/2014    Procedure: LAPAROSCOPIC BILATERAL SALPINGECTOMY;  Surgeon: Konrad Felix, MD;  Location: [facility];  Service: Gynecology;  Laterality: Bilateral;   Family History  Problem Relation Age of Onset  . Hypertension Mother   . Diabetes Mother   . Heart disease Mother   . Heart disease Father   . Other Neg Hx    Social History  Substance Use Topics  . Smoking status: Former Smoker -- 0.25 packs/day for 3 years    Types: Cigarettes    Quit date: 09/25/2013  . Smokeless tobacco: Never Used  . Alcohol Use: Yes     Comment: occasionally   OB History    Gravida Para Term Preterm AB TAB  SAB Ectopic Multiple Living   6 4 4  2  0 2  0 4     Review of Systems  All other systems reviewed and are negative.     Allergies  Review of patient's allergies indicates no known allergies.  Home Medications   Prior to Admission medications   Medication Sig Start Date End Date Taking? Authorizing Provider  amoxicillin-clavulanate (AUGMENTIN) 875-125 MG per tablet Take 1 tablet by mouth every 12 (twelve) hours. 04/09/15   Riki Sheer, PA-C  diclofenac (VOLTAREN) 75 MG EC tablet 1 tablet every 12 hours for pain and inflammation 04/09/15   Riki Sheer, PA-C  ferrous sulfate 325 (65 FE) MG tablet Take 325 mg by mouth daily.    Historical Provider, MD  HYDROcodone-acetaminophen (NORCO/VICODIN) 5-325 MG per tablet Take 1-2 tablets by mouth every 6 (six) hours as needed for moderate pain. 04/09/15   Riki Sheer, PA-C  ibuprofen (ADVIL,MOTRIN) 800 MG tablet Take 1 tablet (800 mg total) by mouth every 8 (eight) hours as needed for mild pain or moderate pain. 10/07/14   Hoover Browns, MD  NIFEdipine (PROCARDIA-XL/ADALAT CC) 30 MG 24 hr tablet Take 1 tablet (30 mg total) by mouth daily. 08/13/14   Venus Standard, CNM  oxyCODONE-acetaminophen (PERCOCET/ROXICET) 5-325 MG per tablet Take 1-2 tablets  by mouth every 4 (four) hours as needed for severe pain. 08/13/14   Nigel Bridgeman, CNM  oxyCODONE-acetaminophen (ROXICET) 5-325 MG per tablet Take 1 tablet by mouth every 4 (four) hours as needed for severe pain. 10/07/14   Hoover Browns, MD  penicillin v potassium (VEETID) 500 MG tablet Take 1 tablet (500 mg total) by mouth 4 (four) times daily. 05/31/15 06/07/15  Robin Mechanic, PA-C  predniSONE (STERAPRED UNI-PAK 48 TAB) 10 MG (48) TBPK tablet Take by mouth daily. 02/18/15   Rodolph Bong, MD  sertraline (ZOLOFT) 25 MG tablet Take 25 mg by mouth daily.    Historical Provider, MD   BP 155/107 mmHg  Pulse 85  Temp(Src) 98.4 F (36.9 C) (Oral)  Resp 26  SpO2 100%  LMP 04/15/2015 Physical Exam   Constitutional: She is oriented to person, place, and time. She appears well-developed and well-nourished. No distress.  HENT:  Head: Normocephalic and atraumatic.  External exam: no asymmetry of the jaw or signs of external infection.    Internal exam: mouth normal.  No signs of abscess infection.  Gum line nonTTP.  Minimally TTP of roof of mouth adjacent to right second maxillary molar.  No signs of abscess of infection.  Floor of the mouth is soft.  Tongue is soft.  No oropharyngeal swelling or edema.    Eyes: Conjunctivae and EOM are normal.  Neck: Neck supple. No tracheal deviation present.  Cardiovascular: Normal rate.   Pulmonary/Chest: Effort normal. No respiratory distress.  Musculoskeletal: Normal range of motion.  Neurological: She is alert and oriented to person, place, and time.  Skin: Skin is warm and dry.  Psychiatric: She has a normal mood and affect. Her behavior is normal.  Nursing note and vitals reviewed.   ED Course  Procedures (including critical care time)  DIAGNOSTIC STUDIES: Oxygen Saturation is 100% on RA, normal by my interpretation.    COORDINATION OF CARE:  9:58 PM Discussed treatment plan with patient at bedside.  Patient acknowledges and agrees with plan.     Labs Review Labs Reviewed - No data to display  Imaging Review No results found. I have personally reviewed and evaluated these images and lab results as part of my medical decision-making.   EKG Interpretation None      MDM   Final diagnoses:  Pain, dental      Labs:  Imaging:  Therapeutics:  Assessment/Plan: Pt presents with complaints of dental abscess.No visible or palpable abscess noted. No signs of infection. Pt insists that she had abscess, she will be placed on antibiotics and instructed to follow-up with dentist immediatly. Ibuprofen or tylenol for pain. She verbalized understanding and agreement to today's plan.   Discharge Meds:penicillin vK  I personally  performed the services described in this documentation, which was scribed in my presence. The recorded information has been reviewed and is accurate.   Robin Mechanic, PA-C 06/02/15 1355  Robin Mulders, MD 06/04/15 [phone]

## 2015-05-31 NOTE — ED Notes (Signed)
Pt has had right upper dental pain in the back of her mouth the last 2 days. Thinks she has a dental abscess.

## 2015-06-14 IMAGING — US US OB TRANSVAGINAL
1 series · 14 of 28 positions shown · non-contrast
Comparison: 12/19/2013

CLINICAL DATA: Cramping.

EXAM:
TRANSVAGINAL OB ULTRASOUND
TECHNIQUE: Transvaginal ultrasound was performed for complete evaluation of the
gestation as well as the maternal uterus, adnexal regions, and
pelvic cul-de-sac.

[Series 1: us ob transvaginal · 44 acquisitions, 14 frames shown]
[im 2/44]
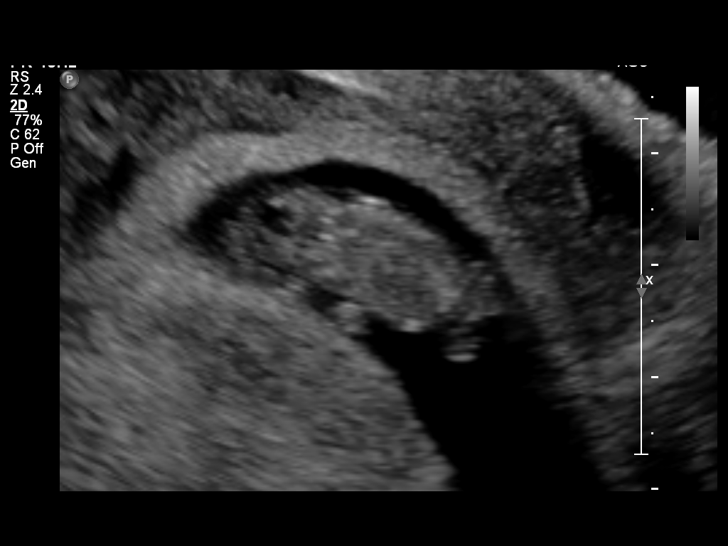
[im 5/44]
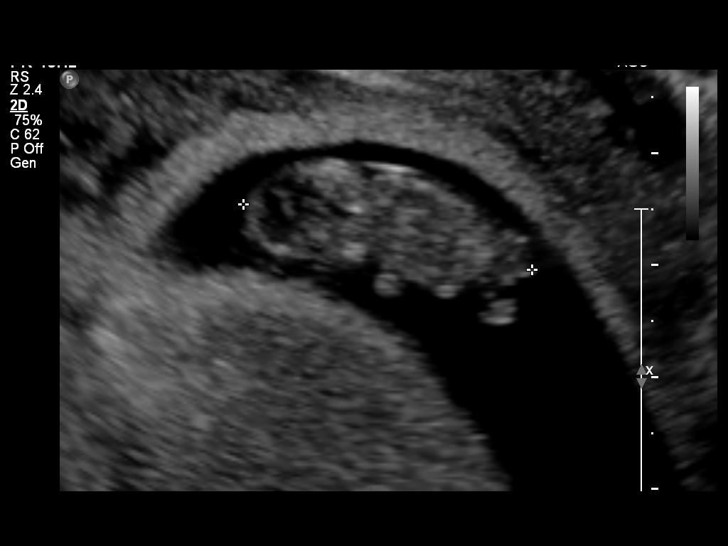
[im 8/44]
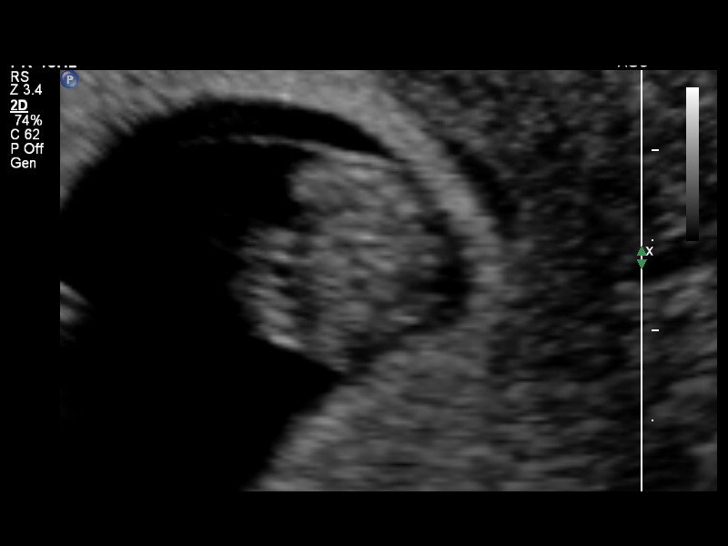
[im 12/44]
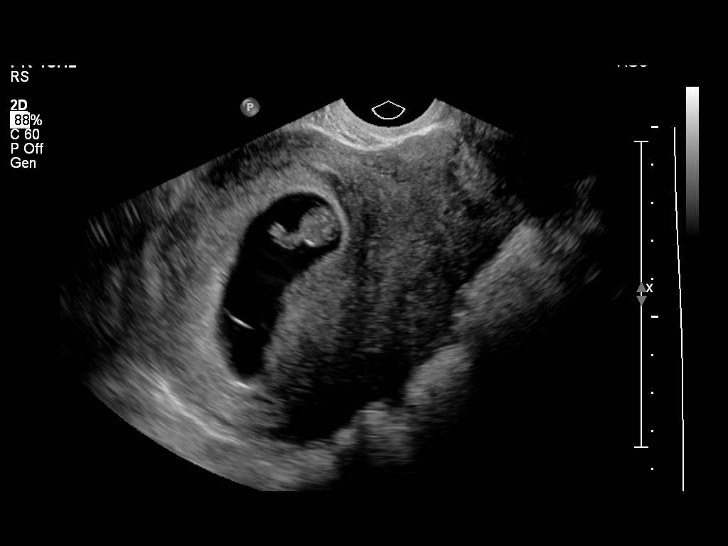
[im 15/44]
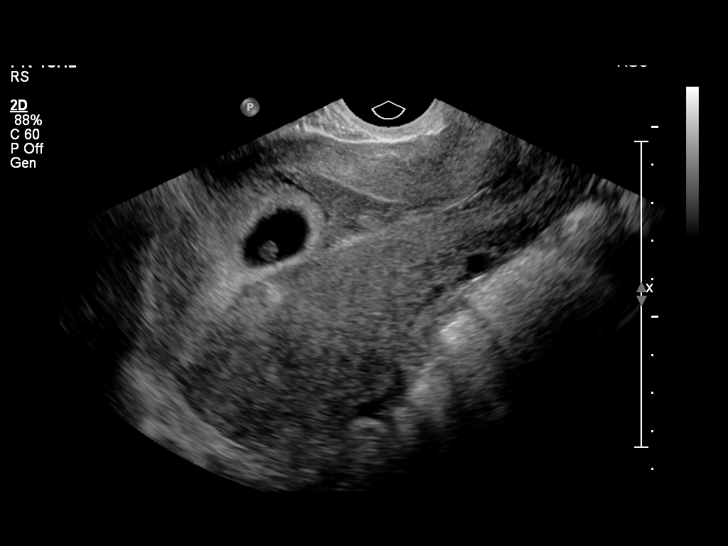
[im 18/44]
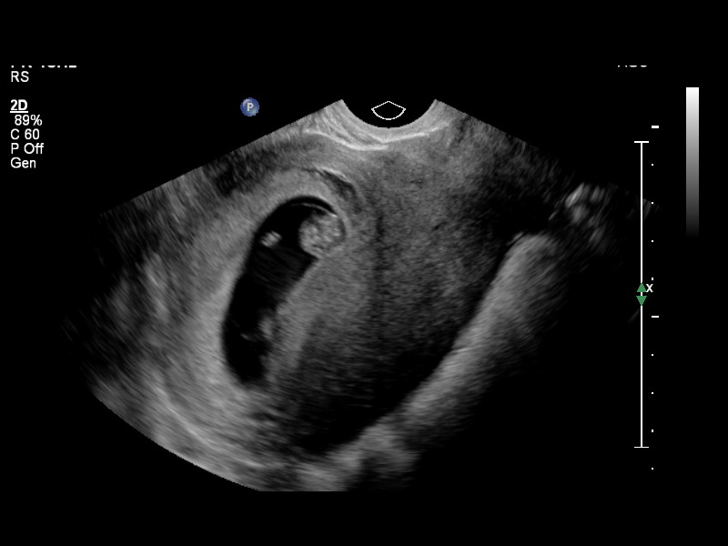
[im 21/44]
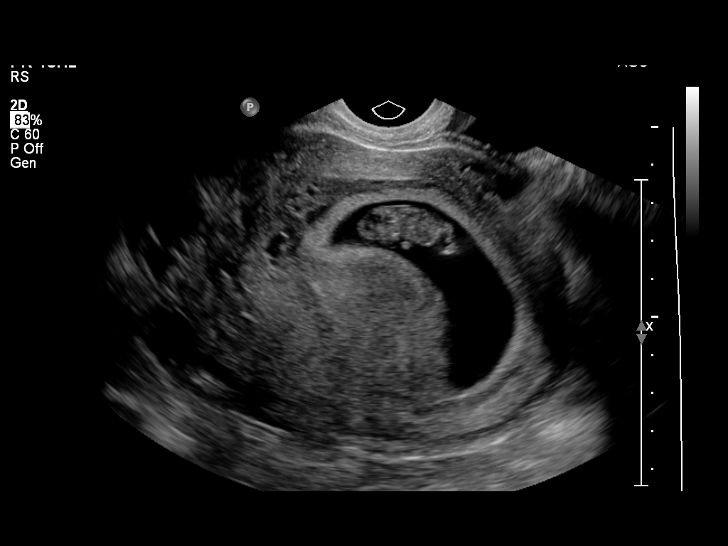
[im 24/44]
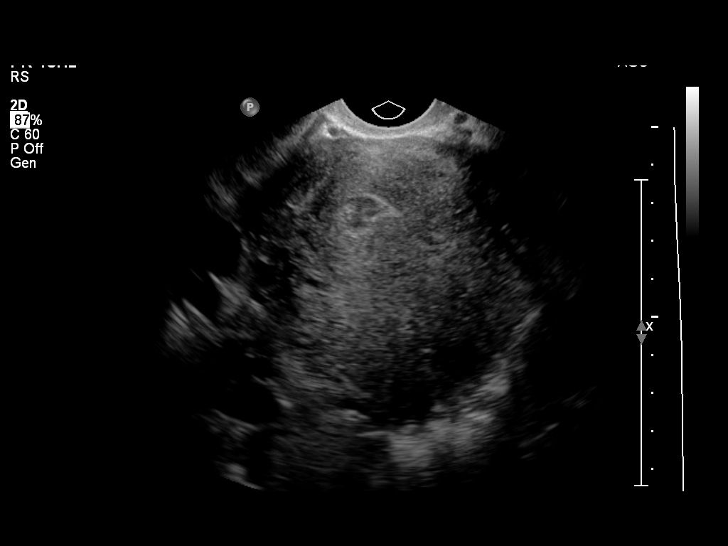
[im 28/44]
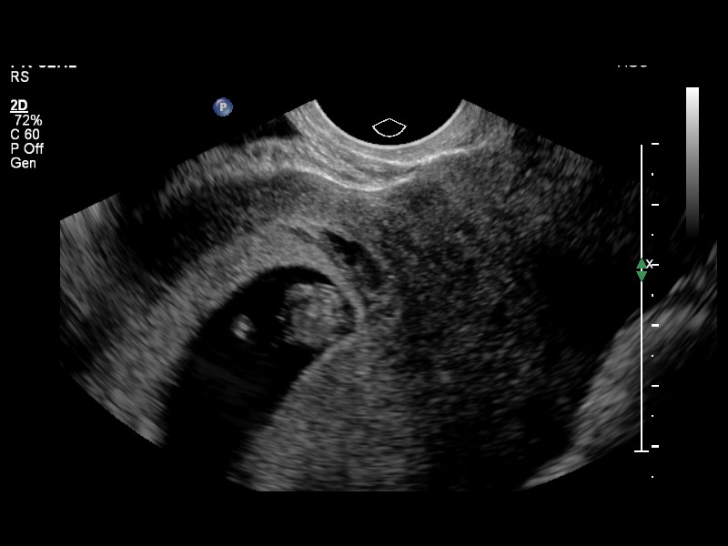
[im 31/44]
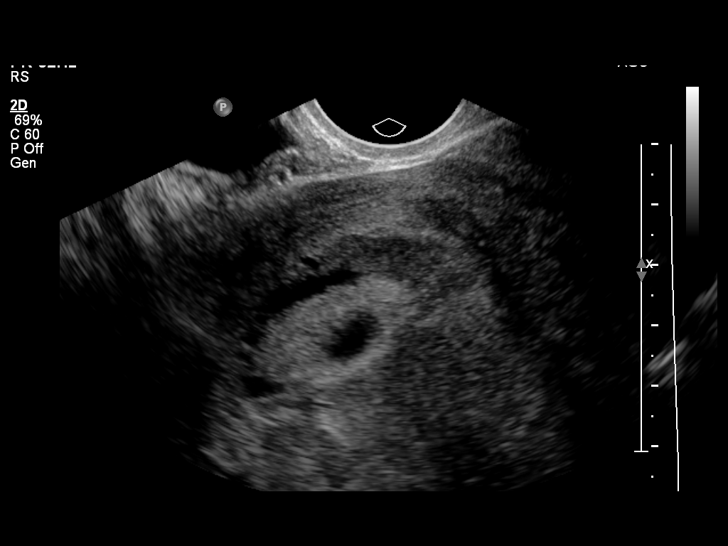
[im 34/44]
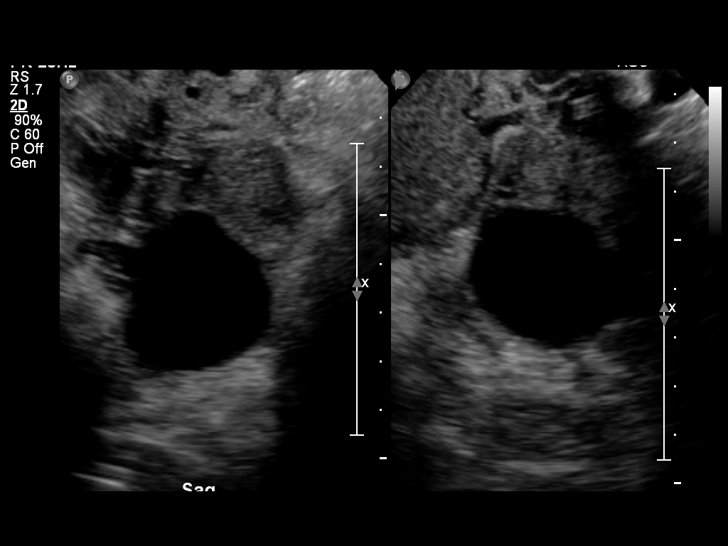
[im 37/44]
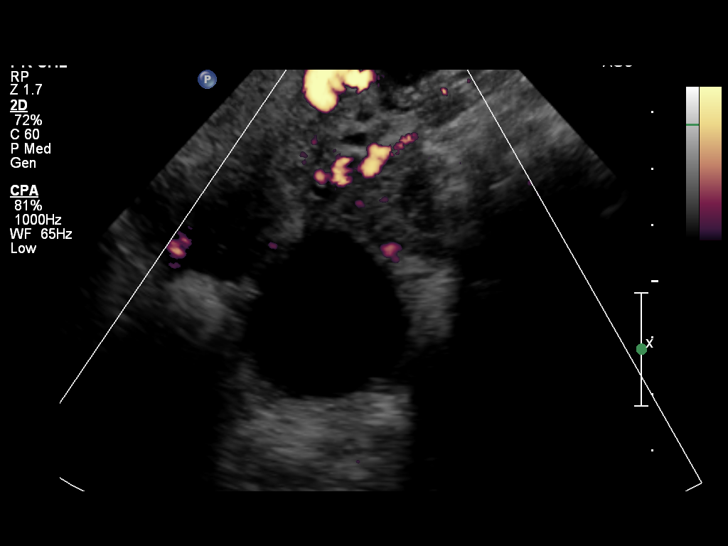
[im 40/44]
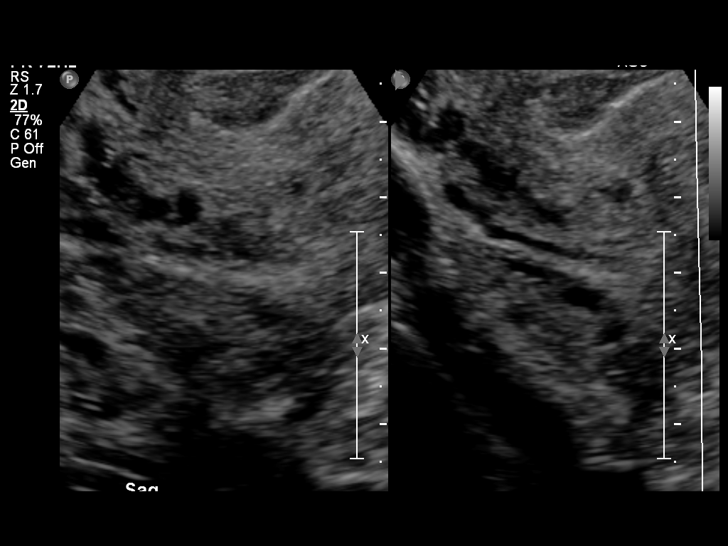
[im 44/44]
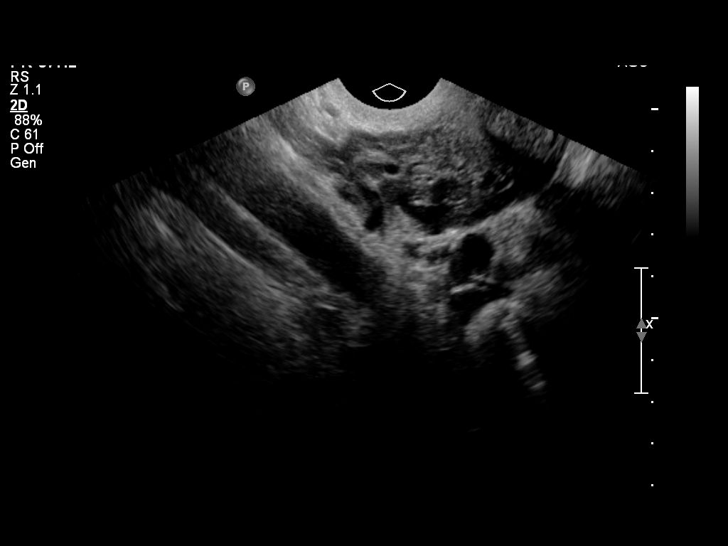

[14 of 28 positions shown; findings below may reference images not displayed]

FINDINGS: Intrauterine gestational sac: Visualized/normal in shape.

Yolk sac:  Yes

Embryo:  Yes

Cardiac Activity: Yes

Heart Rate: 160 bpm

CRL:   2.7 cm 9 w 4 d                  US EDC: 08/16/2014

Maternal uterus/adnexae:

Subchorionic hemorrhage: There is a small subchorionic hemorrhage.
This measures 3.6 x 1.4 x 3.2 cm.

Right ovary: Normal

Left ovary: There is a cyst measuring 3.5 x 5.0 x 3.2 cm.

Other :None

Free fluid:  No free fluid
IMPRESSION: 1. Single living intrauterine gestation. The estimated gestational
age is 9 weeks and 4 days. This is concordant with the gestational
age from ultrasound dated 12/19/2013.
[DATE]. Small subchorionic hemorrhage.

## 2017-04-03 ENCOUNTER — Encounter (HOSPITAL_COMMUNITY): Payer: Self-pay | Admitting: Emergency Medicine

## 2017-04-03 ENCOUNTER — Emergency Department (HOSPITAL_COMMUNITY)
Admission: EM | Admit: 2017-04-03 | Discharge: 2017-04-03 | Disposition: A | Discharge status: Home / Self Care | Payer: Medicaid Other | Attending: Emergency Medicine | Admitting: Emergency Medicine

## 2017-04-03 DIAGNOSIS — J0110 Acute frontal sinusitis, unspecified: Secondary | ICD-10-CM | POA: Insufficient documentation

## 2017-04-03 DIAGNOSIS — Z791 Long term (current) use of non-steroidal anti-inflammatories (NSAID): Secondary | ICD-10-CM | POA: Insufficient documentation

## 2017-04-03 DIAGNOSIS — N10 Acute tubulo-interstitial nephritis: Secondary | ICD-10-CM | POA: Insufficient documentation

## 2017-04-03 DIAGNOSIS — Z79899 Other long term (current) drug therapy: Secondary | ICD-10-CM | POA: Insufficient documentation

## 2017-04-03 DIAGNOSIS — Z7952 Long term (current) use of systemic steroids: Secondary | ICD-10-CM | POA: Insufficient documentation

## 2017-04-03 DIAGNOSIS — Z87891 Personal history of nicotine dependence: Secondary | ICD-10-CM | POA: Insufficient documentation

## 2017-04-03 LAB — URINALYSIS, ROUTINE W REFLEX MICROSCOPIC
Bilirubin Urine: NEGATIVE
Glucose, UA: NEGATIVE mg/dL
Hgb urine dipstick: NEGATIVE
KETONES UR: NEGATIVE mg/dL
Nitrite: NEGATIVE
Protein, ur: NEGATIVE mg/dL
Specific Gravity, Urine: 1.029 (ref 1.005–1.030)
pH: 5 (ref 5.0–8.0)

## 2017-04-03 LAB — PREGNANCY, URINE: Preg Test, Ur: NEGATIVE

## 2017-04-03 LAB — POC URINE PREG, ED: Preg Test, Ur: NEGATIVE

## 2017-04-03 MED ORDER — LEVOFLOXACIN 750 MG PO TABS: 750.0000 mg | ORAL_TABLET | Freq: Every day | ORAL | 0 refills | Status: DC

## 2017-04-03 MED ORDER — IBUPROFEN 600 MG PO TABS: 600.0000 mg | ORAL_TABLET | Freq: Four times a day (QID) | ORAL | 0 refills | Status: DC | PRN

## 2017-04-03 MED ORDER — IBUPROFEN 200 MG PO TABS
600.0000 mg | ORAL_TABLET | Freq: Once | ORAL | Status: AC
  Administered 2017-04-03: 600 mg via ORAL
  Filled 2017-04-03: qty 3

## 2017-04-03 MED ORDER — LORATADINE 10 MG PO TABS: 10.0000 mg | ORAL_TABLET | Freq: Every day | ORAL | 0 refills | Status: DC

## 2017-04-03 MED ORDER — FLUTICASONE PROPIONATE 50 MCG/ACT NA SUSP: 2.0000 | Freq: Every day | NASAL | 0 refills | Status: DC

## 2017-04-03 MED ORDER — LEVOFLOXACIN 750 MG PO TABS
750.0000 mg | ORAL_TABLET | Freq: Once | ORAL | Status: AC
  Administered 2017-04-03: 750 mg via ORAL
  Filled 2017-04-03: qty 1

## 2017-04-03 NOTE — ED Triage Notes (Addendum)
Pt c/o tactile fever onset Saturday. Treated fever with Tylenol, but continued to feel "out of body," back ache, "lung" pain, and diaphoretic. No nausea, emesis, diarrhea. Pt reports also having dark urine and "feeling like I have a UTI." No dysuria, urinary frequency. Self-treated with cranberry pills.

## 2017-04-03 NOTE — ED Provider Notes (Signed)
[facility]-EMERGENCY DEPT Provider Note   CSN: [id number] Arrival date & time: 04/03/17  1129     History   Chief Complaint Chief Complaint  Patient presents with  . Fever  . Generalized Body Aches    HPI Robin Hodge is a 31 y.o. female.  HPI Patient presents with subjective fever, chills, myalgias, sweats for the past 4 days. She endorses urinary frequency, urgency and discoloration over that time period. She also c/o throbbing frontal HA of gradual onset over the last day. Associated with nasal congestion. No photophobia, nausea, neck pain, focal weakness or numbness. Pt states she has been taking tylenol for fever. No known sick contacts.  Past Medical History:  Diagnosis Date  . Abnormal Pap smear    colpo-ok since  . Anemia   . Anxiety   . Depression   . GERD (gastroesophageal reflux disease)    with pregnancy  . Heart murmur    pt denies  . Obese     Patient Active Problem List   Diagnosis Date Noted  . Vaginal delivery 08/11/2014  . Susceptible to varicella (non-immune), currently pregnant 04/10/2014  . Rh negative state in antepartum period 04/10/2014    Past Surgical History:  Procedure Laterality Date  . DILATION AND CURETTAGE OF UTERUS    . LAPAROSCOPIC BILATERAL SALPINGECTOMY Bilateral 10/07/2014   Procedure: LAPAROSCOPIC BILATERAL SALPINGECTOMY;  Surgeon: Konrad FelixEma Wakuru Kulwa, MD;  Location: [facility];  Service: Gynecology;  Laterality: Bilateral;    OB History    Gravida Para Term Preterm AB Living   6 4 4   2 4    SAB TAB Ectopic Multiple Live Births   2 0   0 4       Home Medications    Prior to Admission medications   Medication Sig Start Date End Date Taking? Authorizing Provider  amoxicillin-clavulanate (AUGMENTIN) 875-125 MG per tablet Take 1 tablet by mouth every 12 (twelve) hours. 04/09/15   Riki SheerYoung, Michelle G, PA-C  diclofenac (VOLTAREN) 75 MG EC tablet 1 tablet every 12 hours for pain and inflammation 04/09/15   Young, Dillard CannonMichelle G, PA-C  ferrous  sulfate 325 (65 FE) MG tablet Take 325 mg by mouth daily.    [provider]  fluticasone (FLONASE) 50 MCG/ACT nasal spray Place 2 sprays into both nostrils daily. 04/03/17   Loren RacerYelverton, Jenella Craigie, MD  HYDROcodone-acetaminophen (NORCO/VICODIN) 5-325 MG per tablet Take 1-2 tablets by mouth every 6 (six) hours as needed for moderate pain. 04/09/15   Riki SheerYoung, Michelle G, PA-C  ibuprofen (ADVIL,MOTRIN) 600 MG tablet Take 1 tablet (600 mg total) by mouth every 6 (six) hours as needed for fever, headache or moderate pain. 04/03/17   Loren RacerYelverton, Cieanna Stormes, MD  levofloxacin (LEVAQUIN) 750 MG tablet Take 1 tablet (750 mg total) by mouth daily. 04/04/17   Loren RacerYelverton, Taren Dymek, MD  loratadine (CLARITIN) 10 MG tablet Take 1 tablet (10 mg total) by mouth daily. 04/03/17   Loren RacerYelverton, Vandora Jaskulski, MD  NIFEdipine (PROCARDIA-XL/ADALAT CC) 30 MG 24 hr tablet Take 1 tablet (30 mg total) by mouth daily. 08/13/14   Standard, Venus, CNM  oxyCODONE-acetaminophen (PERCOCET/ROXICET) 5-325 MG per tablet Take 1-2 tablets by mouth every 4 (four) hours as needed for severe pain. 08/13/14   Nigel BridgemanLatham, Vicki, CNM  oxyCODONE-acetaminophen (ROXICET) 5-325 MG per tablet Take 1 tablet by mouth every 4 (four) hours as needed for severe pain. 10/07/14   Hoover BrownsKulwa, Ema, MD  predniSONE (STERAPRED UNI-PAK 48 TAB) 10 MG (48) TBPK tablet Take by mouth daily. 02/18/15  Rodolph Bong, MD  sertraline (ZOLOFT) 25 MG tablet Take 25 mg by mouth daily.    [provider]    Family History Family History  Problem Relation Age of Onset  . Hypertension Mother   . Diabetes Mother   . Heart disease Mother   . Heart disease Father   . Other Neg Hx     Social History Social History  Substance Use Topics  . Smoking status: Former Smoker    Packs/day: 0.25    Years: 3.00    Types: Cigarettes    Quit date: 09/25/2013  . Smokeless tobacco: Never Used  . Alcohol use Yes     Comment: occasionally     Allergies   Patient has no known allergies.   Review of  Systems Review of Systems  Constitutional: Positive for activity change, chills, diaphoresis, fatigue and fever.  HENT: Positive for congestion, sinus pain and sinus pressure. Negative for facial swelling, rhinorrhea, sore throat, trouble swallowing and voice change.   Eyes: Negative for photophobia and visual disturbance.  Respiratory: Negative for cough, chest tightness and shortness of breath.   Cardiovascular: Negative for chest pain, palpitations and leg swelling.  Gastrointestinal: Negative for abdominal pain, constipation, diarrhea, nausea and vomiting.  Genitourinary: Positive for flank pain and frequency. Negative for difficulty urinating, dysuria, hematuria, pelvic pain, vaginal bleeding and vaginal discharge.  Musculoskeletal: Positive for back pain and myalgias. Negative for neck pain and neck stiffness.  Skin: Negative for rash and wound.  Neurological: Positive for headaches. Negative for dizziness, syncope, weakness, light-headedness and numbness.  All other systems reviewed and are negative.    Physical Exam Updated Vital Signs BP 131/82   Pulse 100   Temp 98.3 F (36.8 C) (Oral)   Resp 16   SpO2 100%   Physical Exam  Constitutional: She is oriented to person, place, and time. She appears well-developed and well-nourished. No distress.  HENT:  Head: Normocephalic and atraumatic.  Mouth/Throat: Oropharynx is clear and moist. No oropharyngeal exudate.  bl nasal mucosal edema. Bl frontal sinus TTP.   Eyes: EOM are normal. Pupils are equal, round, and reactive to light.  Neck: Normal range of motion. Neck supple.  No meningismus   Cardiovascular: Normal rate and regular rhythm.  Exam reveals no gallop and no friction rub.   No murmur heard. Pulmonary/Chest: Effort normal and breath sounds normal. No respiratory distress. She has no wheezes. She has no rales. She exhibits no tenderness.  Abdominal: Soft. Bowel sounds are normal. There is no tenderness. There is no  rebound and no guarding.  Musculoskeletal: Normal range of motion. She exhibits tenderness. She exhibits no edema.  Mild L flank TTP. No midline T/L TTP. No LE swelling, TTP, or asymmetry   Lymphadenopathy:    She has no cervical adenopathy.  Neurological: She is alert and oriented to person, place, and time.  Patient is alert and oriented x3 with clear, goal oriented speech. Patient has 5/5 motor in all extremities. Sensation is intact to light touch. Patient has a normal gait and walks without assistance.  Skin: Skin is warm and dry. Capillary refill takes less than 2 seconds. No rash noted. No erythema.  Psychiatric: She has a normal mood and affect. Her behavior is normal.  Nursing note and vitals reviewed.    ED Treatments / Results  Labs (all labs ordered are listed, but only abnormal results are displayed) Labs Reviewed  URINALYSIS, ROUTINE W REFLEX MICROSCOPIC - Abnormal; Notable for the following:  Result Value   APPearance CLOUDY (*)    Leukocytes, UA MODERATE (*)    Bacteria, UA MANY (*)    Squamous Epithelial / LPF 6-30 (*)    All other components within normal limits  URINE CULTURE  PREGNANCY, URINE  POC URINE PREG, ED    EKG  EKG Interpretation None       Radiology No results found.  Procedures Procedures (including critical care time)  Medications Ordered in ED Medications  levofloxacin (LEVAQUIN) tablet 750 mg (not administered)  ibuprofen (ADVIL,MOTRIN) tablet 600 mg (not administered)     Initial Impression / Assessment and Plan / ED Course  I have reviewed the triage vital signs and the nursing notes.  Pertinent labs & imaging results that were available during my care of the patient were reviewed by me and considered in my medical decision making (see chart for details).     Will treat for early onset pelvis 5 days and sinusitis. No red flag signs or symptoms regarding headache. Return precautions have been given.  Final Clinical  Impressions(s) / ED Diagnoses   Final diagnoses:  Acute pyelonephritis  Acute frontal sinusitis, recurrence not specified    New Prescriptions New Prescriptions   FLUTICASONE (FLONASE) 50 MCG/ACT NASAL SPRAY    Place 2 sprays into both nostrils daily.   IBUPROFEN (ADVIL,MOTRIN) 600 MG TABLET    Take 1 tablet (600 mg total) by mouth every 6 (six) hours as needed for fever, headache or moderate pain.   LEVOFLOXACIN (LEVAQUIN) 750 MG TABLET    Take 1 tablet (750 mg total) by mouth daily.   LORATADINE (CLARITIN) 10 MG TABLET    Take 1 tablet (10 mg total) by mouth daily.     Loren Racer, MD 04/03/17 1550

## 2017-04-04 LAB — URINE CULTURE

## 2018-07-11 ENCOUNTER — Encounter (HOSPITAL_COMMUNITY): Payer: Self-pay | Admitting: *Deleted

## 2018-07-11 ENCOUNTER — Other Ambulatory Visit: Payer: Self-pay

## 2018-07-11 DIAGNOSIS — R350 Frequency of micturition: Secondary | ICD-10-CM | POA: Insufficient documentation

## 2018-07-11 DIAGNOSIS — M549 Dorsalgia, unspecified: Secondary | ICD-10-CM | POA: Insufficient documentation

## 2018-07-11 DIAGNOSIS — Z87891 Personal history of nicotine dependence: Secondary | ICD-10-CM | POA: Insufficient documentation

## 2018-07-11 LAB — BASIC METABOLIC PANEL
Anion gap: 10 (ref 5–15)
BUN: 13 mg/dL (ref 6–20)
CHLORIDE: 108 mmol/L (ref 98–111)
CO2: 26 mmol/L (ref 22–32)
Calcium: 9.4 mg/dL (ref 8.9–10.3)
Creatinine, Ser: 0.78 mg/dL (ref 0.44–1.00)
GFR calc Af Amer: >60 mL/min (ref >60)
GFR calc non Af Amer: >60 mL/min (ref >60)
Glucose, Bld: 96 mg/dL (ref 70–99)
Potassium: 3.7 mmol/L (ref 3.5–5.1)
Sodium: 144 mmol/L (ref 135–145)

## 2018-07-11 LAB — CBC
HCT: 28.3 % — ABNORMAL LOW (ref 36.0–46.0)
Hemoglobin: 8.8 g/dL — ABNORMAL LOW (ref 12.0–15.0)
MCH: 22.6 pg — ABNORMAL LOW (ref 26.0–34.0)
MCHC: 31.1 g/dL (ref 30.0–36.0)
MCV: 72.6 fL — AB (ref 78.0–100.0)
PLATELETS: 419 10*3/uL — AB (ref 150–400)
RBC: 3.9 MIL/uL (ref 3.87–5.11)
RDW: 19.4 % — AB (ref 11.5–15.5)
WBC: 6.8 10*3/uL (ref 4.0–10.5)

## 2018-07-11 LAB — I-STAT BETA HCG BLOOD, ED (MC, WL, AP ONLY): I-stat hCG, quantitative: <5 m[IU]/mL (ref <5)

## 2018-07-11 NOTE — ED Triage Notes (Signed)
Pt reports two days of left sided lower back pain, fevers, nausea, odorous urine. Last tylenol 2-3 hours ago.  Also one week of lower dental pain where she has a cracked tooth.

## 2018-07-12 ENCOUNTER — Emergency Department (HOSPITAL_COMMUNITY)
Admission: EM | Admit: 2018-07-12 | Discharge: 2018-07-12 | Disposition: A | Discharge status: Home / Self Care | Payer: Self-pay | Attending: Emergency Medicine | Admitting: Emergency Medicine

## 2018-07-12 DIAGNOSIS — N3000 Acute cystitis without hematuria: Secondary | ICD-10-CM

## 2018-07-12 LAB — URINALYSIS, ROUTINE W REFLEX MICROSCOPIC
Bilirubin Urine: NEGATIVE
GLUCOSE, UA: NEGATIVE mg/dL
HGB URINE DIPSTICK: NEGATIVE
KETONES UR: 5 mg/dL — AB
LEUKOCYTES UA: NEGATIVE
NITRITE: POSITIVE — AB
PH: 5 (ref 5.0–8.0)
Protein, ur: NEGATIVE mg/dL
Specific Gravity, Urine: 1.027 (ref 1.005–1.030)

## 2018-07-12 MED ORDER — CIPROFLOXACIN HCL 500 MG PO TABS: 500.0000 mg | ORAL_TABLET | Freq: Two times a day (BID) | ORAL | 0 refills | Status: DC

## 2018-07-12 NOTE — Discharge Instructions (Addendum)
Cipro as prescribed.  Ibuprofen 600 mill grams every 6 hours as needed for pain.  Return to the emergency department for worsening pain, high fever, or other new and concerning symptoms.

## 2018-07-12 NOTE — ED Provider Notes (Signed)
[facility]-EMERGENCY DEPT Provider Note   CSN: [id number] Arrival date & time: 07/11/18  2113     History   Chief Complaint Chief Complaint  Patient presents with  . Back Pain    HPI Robin Hodge is a 32 y.o. female.  Patient is a 32 year old female with history of anemia, depression, GERD, and prior UTI.  She presents with complaints of back pain and urinary frequency.  This is been ongoing for the past several days.  She denies any fevers or chills.  This feels similar to prior UTIs.  The history is provided by the patient.  Back Pain   This is a new problem. The current episode started 2 days ago. The problem occurs constantly. The problem has been gradually worsening. The pain is associated with no known injury. The quality of the pain is described as aching. The pain is moderate. Associated symptoms include dysuria. Pertinent negatives include no fever. She has tried nothing for the symptoms.    Past Medical History:  Diagnosis Date  . Abnormal Pap smear    colpo-ok since  . Anemia   . Anxiety   . Depression   . GERD (gastroesophageal reflux disease)    with pregnancy  . Heart murmur    pt denies  . Obese     Patient Active Problem List   Diagnosis Date Noted  . Vaginal delivery 08/11/2014  . Susceptible to varicella (non-immune), currently pregnant 04/10/2014  . Rh negative state in antepartum period 04/10/2014    Past Surgical History:  Procedure Laterality Date  . DILATION AND CURETTAGE OF UTERUS    . LAPAROSCOPIC BILATERAL SALPINGECTOMY Bilateral 10/07/2014   Procedure: LAPAROSCOPIC BILATERAL SALPINGECTOMY;  Surgeon: Konrad Felix, MD;  Location: [facility];  Service: Gynecology;  Laterality: Bilateral;     OB History    Gravida  6   Para  4   Term  4   Preterm      AB  2   Living  4     SAB  2   TAB  0   Ectopic      Multiple  0   Live Births  4            Home Medications    Prior to Admission  medications   Medication Sig Start Date End Date Taking? Authorizing Provider  diphenhydrAMINE (BENADRYL) 25 mg capsule Take 50 mg by mouth at bedtime as needed for allergies.   Yes [provider]  ibuprofen (ADVIL,MOTRIN) 200 MG tablet Take 800 mg by mouth every 6 (six) hours as needed for moderate pain.   Yes [provider]  amoxicillin-clavulanate (AUGMENTIN) 875-125 MG per tablet Take 1 tablet by mouth every 12 (twelve) hours. Patient not taking: Reported on 07/12/2018 04/09/15   Riki Sheer, PA-C  diclofenac (VOLTAREN) 75 MG EC tablet 1 tablet every 12 hours for pain and inflammation Patient not taking: Reported on 07/12/2018 04/09/15   Riki Sheer, PA-C  fluticasone [facility]) 50 MCG/ACT nasal spray Place 2 sprays into both nostrils daily. Patient not taking: Reported on 07/12/2018 04/03/17   Loren Racer, MD  HYDROcodone-acetaminophen (NORCO/VICODIN) 5-325 MG per tablet Take 1-2 tablets by mouth every 6 (six) hours as needed for moderate pain. Patient not taking: Reported on 07/12/2018 04/09/15   Riki Sheer, PA-C  ibuprofen (ADVIL,MOTRIN) 600 MG tablet Take 1 tablet (600 mg total) by mouth every 6 (six) hours as needed for fever, headache or moderate  pain. Patient not taking: Reported on 07/12/2018 04/03/17   Loren Racer, MD  levofloxacin (LEVAQUIN) 750 MG tablet Take 1 tablet (750 mg total) by mouth daily. Patient not taking: Reported on 07/12/2018 04/04/17   Loren Racer, MD  loratadine (CLARITIN) 10 MG tablet Take 1 tablet (10 mg total) by mouth daily. Patient not taking: Reported on 07/12/2018 04/03/17   Loren Racer, MD  NIFEdipine (PROCARDIA-XL/ADALAT CC) 30 MG 24 hr tablet Take 1 tablet (30 mg total) by mouth daily. Patient not taking: Reported on 07/12/2018 08/13/14   Standard, Venus, CNM  oxyCODONE-acetaminophen (PERCOCET/ROXICET) 5-325 MG per tablet Take 1-2 tablets by mouth every 4 (four) hours as needed for severe pain. Patient not taking:  Reported on 07/12/2018 08/13/14   Nigel Bridgeman, CNM  oxyCODONE-acetaminophen (ROXICET) 5-325 MG per tablet Take 1 tablet by mouth every 4 (four) hours as needed for severe pain. Patient not taking: Reported on 07/12/2018 10/07/14   Hoover Browns, MD  predniSONE (STERAPRED UNI-PAK 48 TAB) 10 MG (48) TBPK tablet Take by mouth daily. Patient not taking: Reported on 07/12/2018 02/18/15   Rodolph Bong, MD    Family History Family History  Problem Relation Age of Onset  . Hypertension Mother   . Diabetes Mother   . Heart disease Mother   . Heart disease Father   . Other Neg Hx     Social History Social History   Tobacco Use  . Smoking status: Former Smoker    Packs/day: 0.25    Years: 3.00    Pack years: 0.75    Types: Cigarettes    Last attempt to quit: 09/25/2013    Years since quitting: 4.7  . Smokeless tobacco: Never Used  Substance Use Topics  . Alcohol use: Yes    Comment: occasionally  . Drug use: No     Allergies   Patient has no known allergies.   Review of Systems Review of Systems  Constitutional: Negative for fever.  Genitourinary: Positive for dysuria.  Musculoskeletal: Positive for back pain.  All other systems reviewed and are negative.    Physical Exam Updated Vital Signs BP (!) 140/91 (BP Location: Left Arm)   Pulse 83   Temp 98.9 F (37.2 C) (Oral)   Resp 18   Ht 5\' 7"  (1.702 m)   Wt (!) 143.8 kg   LMP 07/05/2018   SpO2 100%   BMI 49.65 kg/m   Physical Exam  Constitutional: She is oriented to person, place, and time. She appears well-developed and well-nourished. No distress.  HENT:  Head: Normocephalic and atraumatic.  Neck: Normal range of motion. Neck supple.  Cardiovascular: Normal rate and regular rhythm. Exam reveals no gallop and no friction rub.  No murmur heard. Pulmonary/Chest: Effort normal and breath sounds normal. No respiratory distress. She has no wheezes.  Abdominal: Soft. Bowel sounds are normal. She exhibits no distension.  There is no tenderness.  Musculoskeletal: Normal range of motion.  Neurological: She is alert and oriented to person, place, and time.  Skin: Skin is warm and dry. She is not diaphoretic.  Nursing note and vitals reviewed.    ED Treatments / Results  Labs (all labs ordered are listed, but only abnormal results are displayed) Labs Reviewed  CBC - Abnormal; Notable for the following components:      Result Value   Hemoglobin 8.8 (*)    HCT 28.3 (*)    MCV 72.6 (*)    MCH 22.6 (*)    RDW 19.4 (*)  Platelets 419 (*)    All other components within normal limits  BASIC METABOLIC PANEL  URINALYSIS, ROUTINE W REFLEX MICROSCOPIC  I-STAT BETA HCG BLOOD, ED ([facility], [facility], [facility] ONLY)    EKG None  Radiology No results found.  Procedures Procedures (including critical care time)  Medications Ordered in ED Medications - No data to display   Initial Impression / Assessment and Plan / ED Course  I have reviewed the triage vital signs and the nursing notes.  Pertinent labs & imaging results that were available during my care of the patient were reviewed by me and considered in my medical decision making (see chart for details).  Urinalysis with positive nitrates.  She will be treated with Cipro for a UTI.  She is to return as needed if symptoms worsen.  Final Clinical Impressions(s) / ED Diagnoses   Final diagnoses:  None    ED Discharge Orders    None       Geoffery Lyons, MD 07/12/18 [phone]

## 2018-12-29 ENCOUNTER — Other Ambulatory Visit: Payer: Self-pay

## 2018-12-29 ENCOUNTER — Emergency Department (HOSPITAL_COMMUNITY)
Admission: EM | Admit: 2018-12-29 | Discharge: 2018-12-29 | Disposition: A | Discharge status: Home / Self Care | Payer: Self-pay | Attending: Emergency Medicine | Admitting: Emergency Medicine

## 2018-12-29 DIAGNOSIS — K029 Dental caries, unspecified: Secondary | ICD-10-CM | POA: Insufficient documentation

## 2018-12-29 DIAGNOSIS — Z87891 Personal history of nicotine dependence: Secondary | ICD-10-CM | POA: Insufficient documentation

## 2018-12-29 DIAGNOSIS — R030 Elevated blood-pressure reading, without diagnosis of hypertension: Secondary | ICD-10-CM | POA: Insufficient documentation

## 2018-12-29 DIAGNOSIS — K0889 Other specified disorders of teeth and supporting structures: Secondary | ICD-10-CM | POA: Insufficient documentation

## 2018-12-29 MED ORDER — OXYCODONE-ACETAMINOPHEN 5-325 MG PO TABS
1.0000 | ORAL_TABLET | Freq: Once | ORAL | Status: AC
Start: 2018-12-29 — End: 2018-12-29
  Administered 2018-12-29: 1 via ORAL
  Filled 2018-12-29: qty 1

## 2018-12-29 MED ORDER — PENICILLIN V POTASSIUM 500 MG PO TABS: 500.0000 mg | ORAL_TABLET | Freq: Four times a day (QID) | ORAL | 0 refills | Status: AC

## 2018-12-29 NOTE — ED Triage Notes (Signed)
Pt here for evaluation of right sided dental pain and swelling present when she woke up this morning. Pt sts it was sore yesterday but swelling appeared today. Says she's supposed to be going to a dentist to get a tooth pulled soon.

## 2018-12-29 NOTE — Discharge Instructions (Signed)
Apply warm compresses to jaw throughout the day. Prescription sent to your pharmacy for Penicillin. This is an antibiotic for dental abscess. Please take antibiotic until completion.    You can take Tylenol and Ibuprofen for pain. Please take as directed on the bottle.  Follow up with a dentist is very important for ongoing evaluation and management of recurrent dental pain. Return to emergency department for emergent changing or worsening symptoms.  You can call local dentists in the area to arrange follow up care. One option is to call Dr. Leanord Asal or Dr. Mia Creek [phone] to see if they are able to

## 2018-12-29 NOTE — ED Notes (Signed)
Patient verbalizes understanding of discharge instructions. Opportunity for questioning and answers were provided. Armband removed by staff, pt discharged from ED.  

## 2018-12-29 NOTE — ED Provider Notes (Signed)
[facility] EMERGENCY DEPARTMENT Provider Note   CSN: [id number] Arrival date & time: 12/29/18  1030    History   Chief Complaint Chief Complaint  Patient presents with   Dental Pain    HPI Robin Hodge is a 33 y.o. female presenting to emergency department today with chief complaint of dental pain x2 days.  Patient states 4 days ago she chipped her tooth while eating hard candy.  Patient started having pain at that tooth last night.  She describes the pain as constant and throbbing.  The pain does not radiate.  She rates it 10 out of 10 in severity.  Patient took Tylenol with minimal relief.  Patient also applied ice which she states helped it feel better.  Patient has dental appointment scheduled in 4 days to get that tooth pulled since it has been problems in the past.  Denies fever, chills, shortness of breath, difficulty swallowing. History provided by patient.     Past Medical History:  Diagnosis Date   Abnormal Pap smear    colpo-ok since   Anemia    Anxiety    Depression    GERD (gastroesophageal reflux disease)    with pregnancy   Heart murmur    pt denies   Obese     Patient Active Problem List   Diagnosis Date Noted   Vaginal delivery 08/11/2014   Susceptible to varicella (non-immune), currently pregnant 04/10/2014   Rh negative state in antepartum period 04/10/2014    Past Surgical History:  Procedure Laterality Date   DILATION AND CURETTAGE OF UTERUS     LAPAROSCOPIC BILATERAL SALPINGECTOMY Bilateral 10/07/2014   Procedure: LAPAROSCOPIC BILATERAL SALPINGECTOMY;  Surgeon: Konrad Felix, MD;  Location: [facility];  Service: Gynecology;  Laterality: Bilateral;     OB History    Gravida  6   Para  4   Term  4   Preterm      AB  2   Living  4     SAB  2   TAB  0   Ectopic      Multiple  0   Live Births  4            Home Medications    Prior to Admission medications   Medication Sig Start Date  End Date Taking? Authorizing Provider  amoxicillin-clavulanate (AUGMENTIN) 875-125 MG per tablet Take 1 tablet by mouth every 12 (twelve) hours. Patient not taking: Reported on 07/12/2018 04/09/15   Riki Sheer, PA-C  ciprofloxacin (CIPRO) 500 MG tablet Take 1 tablet (500 mg total) by mouth 2 (two) times daily. One po bid x 7 days 07/12/18   Geoffery Lyons, MD  diclofenac (VOLTAREN) 75 MG EC tablet 1 tablet every 12 hours for pain and inflammation Patient not taking: Reported on 07/12/2018 04/09/15   Riki Sheer, PA-C  diphenhydrAMINE (BENADRYL) 25 mg capsule Take 50 mg by mouth at bedtime as needed for allergies.    [provider]  fluticasone (FLONASE) 50 MCG/ACT nasal spray Place 2 sprays into both nostrils daily. Patient not taking: Reported on 07/12/2018 04/03/17   Loren Racer, MD  HYDROcodone-acetaminophen (NORCO/VICODIN) 5-325 MG per tablet Take 1-2 tablets by mouth every 6 (six) hours as needed for moderate pain. Patient not taking: Reported on 07/12/2018 04/09/15   Riki Sheer, PA-C  ibuprofen (ADVIL,MOTRIN) 200 MG tablet Take 800 mg by mouth every 6 (six) hours as needed for moderate pain.    [provider]  ibuprofen (ADVIL,MOTRIN) 600 MG tablet Take 1 tablet (600 mg total) by mouth every 6 (six) hours as needed for fever, headache or moderate pain. Patient not taking: Reported on 07/12/2018 04/03/17   Loren Racer, MD  levofloxacin (LEVAQUIN) 750 MG tablet Take 1 tablet (750 mg total) by mouth daily. Patient not taking: Reported on 07/12/2018 04/04/17   Loren Racer, MD  loratadine (CLARITIN) 10 MG tablet Take 1 tablet (10 mg total) by mouth daily. Patient not taking: Reported on 07/12/2018 04/03/17   Loren Racer, MD  NIFEdipine (PROCARDIA-XL/ADALAT CC) 30 MG 24 hr tablet Take 1 tablet (30 mg total) by mouth daily. Patient not taking: Reported on 07/12/2018 08/13/14   Standard, Venus, CNM  oxyCODONE-acetaminophen (PERCOCET/ROXICET) 5-325 MG per  tablet Take 1-2 tablets by mouth every 4 (four) hours as needed for severe pain. Patient not taking: Reported on 07/12/2018 08/13/14   Nigel Bridgeman, CNM  oxyCODONE-acetaminophen (ROXICET) 5-325 MG per tablet Take 1 tablet by mouth every 4 (four) hours as needed for severe pain. Patient not taking: Reported on 07/12/2018 10/07/14   Hoover Browns, MD  penicillin v potassium (VEETID) 500 MG tablet Take 1 tablet (500 mg total) by mouth 4 (four) times daily for 7 days. 12/29/18 01/05/19  Darely Becknell E, PA-C  predniSONE (STERAPRED UNI-PAK 48 TAB) 10 MG (48) TBPK tablet Take by mouth daily. Patient not taking: Reported on 07/12/2018 02/18/15   Rodolph Bong, MD    Family History Family History  Problem Relation Age of Onset   Hypertension Mother    Diabetes Mother    Heart disease Mother    Heart disease Father    Other Neg Hx     Social History Social History   Tobacco Use   Smoking status: Former Smoker    Packs/day: 0.25    Years: 3.00    Pack years: 0.75    Types: Cigarettes    Last attempt to quit: 09/25/2013    Years since quitting: 5.2   Smokeless tobacco: Never Used  Substance Use Topics   Alcohol use: Yes    Comment: occasionally   Drug use: No     Allergies   Patient has no known allergies.   Review of Systems Review of Systems  Constitutional: Negative for chills and fever.  HENT: Positive for dental problem and facial swelling. Negative for congestion, ear discharge, ear pain, sinus pressure, sinus pain and sore throat.   Eyes: Negative for pain and redness.  Respiratory: Negative for cough and shortness of breath.   Cardiovascular: Negative for chest pain.  Gastrointestinal: Negative for abdominal pain, constipation, diarrhea, nausea and vomiting.  Genitourinary: Negative for dysuria and hematuria.  Musculoskeletal: Negative for back pain and neck pain.  Skin: Negative for wound.  Neurological: Negative for weakness, numbness and headaches.      Physical Exam Updated Vital Signs BP (!) 161/106 (BP Location: Right Arm)    Pulse 93    Temp 98.6 F (37 C) (Oral)    Resp 14    Ht  (1.702 m)    Wt (!) 144.7 kg    LMP 12/23/2018 (Exact Date)    SpO2 100%    BMI 49.96 kg/m   Physical Exam Vitals signs and nursing note reviewed.  Constitutional:      Appearance: She is well-developed. She is not toxic-appearing.  HENT:     Head: Normocephalic and atraumatic.     Mouth/Throat:     Pharynx: Oropharynx is clear. Uvula midline. No posterior  oropharyngeal erythema or uvula swelling.     Tonsils: 0 on the right. 0 on the left.      Comments: Multiple caries and fractured teeth. No trismus. Mouth opening to at least 3 finger widths. Handles oral secretions without difficulty. External exam shows asymmetry of the jaw line with obvious swelling to right lower jaw. No signs of infection. No swelling or tenderness to the submental or submandibular regions.  Posterior oropharynx clear with no signs of infection, uvula is midline and rises with phonation. No swelling or tenderness into the soft tissues of the neck.Full active range of motion of the jaw. Neck is supple with full active range of motion, no tenderness to palpation of the soft tissues.    Eyes:     General: No scleral icterus.       Right eye: No discharge.        Left eye: No discharge.     Conjunctiva/sclera: Conjunctivae normal.  Neck:     Musculoskeletal: Normal range of motion.  Cardiovascular:     Rate and Rhythm: Normal rate and regular rhythm.     Pulses: Normal pulses.     Heart sounds: Normal heart sounds.  Pulmonary:     Effort: Pulmonary effort is normal.     Breath sounds: Normal breath sounds.  Abdominal:     General: There is no distension.  Musculoskeletal: Normal range of motion.  Skin:    General: Skin is warm and dry.  Neurological:     Mental Status: She is oriented to person, place, and time.     Comments: Fluent speech, no facial droop.   Psychiatric:        Behavior: Behavior normal.      ED Treatments / Results  Labs (all labs ordered are listed, but only abnormal results are displayed) Labs Reviewed - No data to display  EKG None  Radiology No results found.  Procedures Procedures (including critical care time)  Medications Ordered in ED Medications  oxyCODONE-acetaminophen (PERCOCET/ROXICET) 5-325 MG per tablet 1 tablet (1 tablet Oral Given 12/29/18 1057)     Initial Impression / Assessment and Plan / ED Course  I have reviewed the triage vital signs and the nursing notes.  Pertinent labs & imaging results that were available during my care of the patient were reviewed by me and considered in my medical decision making (see chart for details).    Pt is well appearing.  Chronic dental decay. Patient with toothache.  No gross abscess.  Exam unconcerning for Ludwig's angina or spread of infection.  Will treat with penicillin and anti-inflammatories medicine.  Urged patient to follow-up with dentist.  Pt has elevated blood pressure today, likely secondary to pain. Advised pt to recheck blood pressure and follow up with pcp in 1 week if BP continues to be elevated.    Pt appears stable for d/c. Strict ED return precautions given. Pt has appointment scheduled on 3/26 to hopefully get that tooth and maybe another pulled.  This note was prepared with assistance of Conservation officer, historic buildings. Occasional wrong-word or sound-a-like substitutions may have occurred due to the inherent limitations of voice recognition software.    Final Clinical Impressions(s) / ED Diagnoses   Final diagnoses:  Pain, dental    ED Discharge Orders         Ordered    penicillin v potassium (VEETID) 500 MG tablet  4 times daily     12/29/18 1056  Kathyrn Lass 12/29/18 1233    Eber Hong, MD 12/30/18 [phone]

## 2019-01-07 ENCOUNTER — Other Ambulatory Visit: Payer: Self-pay

## 2019-01-07 ENCOUNTER — Emergency Department (HOSPITAL_COMMUNITY)
Admission: EM | Admit: 2019-01-07 | Discharge: 2019-01-08 | Disposition: A | Discharge status: Home / Self Care | Payer: Self-pay | Attending: Emergency Medicine | Admitting: Emergency Medicine

## 2019-01-07 ENCOUNTER — Encounter (HOSPITAL_COMMUNITY): Payer: Self-pay

## 2019-01-07 DIAGNOSIS — R45850 Homicidal ideations: Secondary | ICD-10-CM | POA: Insufficient documentation

## 2019-01-07 DIAGNOSIS — R45851 Suicidal ideations: Secondary | ICD-10-CM | POA: Insufficient documentation

## 2019-01-07 DIAGNOSIS — F332 Major depressive disorder, recurrent severe without psychotic features: Secondary | ICD-10-CM | POA: Insufficient documentation

## 2019-01-07 DIAGNOSIS — F1910 Other psychoactive substance abuse, uncomplicated: Secondary | ICD-10-CM

## 2019-01-07 DIAGNOSIS — F17210 Nicotine dependence, cigarettes, uncomplicated: Secondary | ICD-10-CM | POA: Insufficient documentation

## 2019-01-07 LAB — CBC
HCT: 28.3 % — ABNORMAL LOW (ref 36.0–46.0)
Hemoglobin: 8.4 g/dL — ABNORMAL LOW (ref 12.0–15.0)
MCH: 21.8 pg — ABNORMAL LOW (ref 26.0–34.0)
MCHC: 29.7 g/dL — ABNORMAL LOW (ref 30.0–36.0)
MCV: 73.3 fL — ABNORMAL LOW (ref 80.0–100.0)
PLATELETS: 551 10*3/uL — AB (ref 150–400)
RBC: 3.86 MIL/uL — ABNORMAL LOW (ref 3.87–5.11)
RDW: 19 % — ABNORMAL HIGH (ref 11.5–15.5)
WBC: 7.1 10*3/uL (ref 4.0–10.5)
nRBC: 0 % (ref 0.0–0.2)

## 2019-01-07 LAB — COMPREHENSIVE METABOLIC PANEL
ALT: 20 U/L (ref 0–44)
AST: 26 U/L (ref 15–41)
Albumin: 3.6 g/dL (ref 3.5–5.0)
Alkaline Phosphatase: 61 U/L (ref 38–126)
Anion gap: 10 (ref 5–15)
BUN: 11 mg/dL (ref 6–20)
CHLORIDE: 106 mmol/L (ref 98–111)
CO2: 23 mmol/L (ref 22–32)
Calcium: 8.4 mg/dL — ABNORMAL LOW (ref 8.9–10.3)
Creatinine, Ser: 0.68 mg/dL (ref 0.44–1.00)
GFR calc Af Amer: >60 mL/min (ref >60)
GFR calc non Af Amer: >60 mL/min (ref >60)
Glucose, Bld: 106 mg/dL — ABNORMAL HIGH (ref 70–99)
Potassium: 3.6 mmol/L (ref 3.5–5.1)
Sodium: 139 mmol/L (ref 135–145)
Total Bilirubin: 0.3 mg/dL (ref 0.3–1.2)
Total Protein: 7.5 g/dL (ref 6.5–8.1)

## 2019-01-07 LAB — RAPID URINE DRUG SCREEN, HOSP PERFORMED
Amphetamines: NOT DETECTED
Barbiturates: NOT DETECTED
Benzodiazepines: POSITIVE — AB
Cocaine: POSITIVE — AB
Opiates: POSITIVE — AB
Tetrahydrocannabinol: POSITIVE — AB

## 2019-01-07 LAB — SALICYLATE LEVEL

## 2019-01-07 LAB — I-STAT BETA HCG BLOOD, ED (MC, WL, AP ONLY): I-stat hCG, quantitative: <5 m[IU]/mL (ref <5)

## 2019-01-07 LAB — ETHANOL: Alcohol, Ethyl (B): <10 mg/dL (ref <10)

## 2019-01-07 LAB — ACETAMINOPHEN LEVEL: Acetaminophen (Tylenol), Serum: <10 ug/mL — ABNORMAL LOW (ref 10–30)

## 2019-01-07 MED ORDER — DIPHENHYDRAMINE HCL 25 MG PO CAPS: 50.0000 mg | ORAL_CAPSULE | Freq: Every evening | ORAL | Status: DC | PRN

## 2019-01-07 MED ORDER — ZIPRASIDONE MESYLATE 20 MG IM SOLR: 20.0000 mg | Freq: Once | INTRAMUSCULAR | Status: DC | PRN

## 2019-01-07 MED ORDER — ONDANSETRON HCL 4 MG PO TABS: 4.0000 mg | ORAL_TABLET | Freq: Three times a day (TID) | ORAL | Status: DC | PRN

## 2019-01-07 MED ORDER — ALUM & MAG HYDROXIDE-SIMETH 200-200-20 MG/5ML PO SUSP: 30.0000 mL | Freq: Four times a day (QID) | ORAL | Status: DC | PRN

## 2019-01-07 MED ORDER — ACETAMINOPHEN 325 MG PO TABS
650.0000 mg | ORAL_TABLET | ORAL | Status: DC | PRN
  Administered 2019-01-07 – 2019-01-08 (×2): 650 mg via ORAL
  Filled 2019-01-07 (×2): qty 2

## 2019-01-07 MED ORDER — ZOLPIDEM TARTRATE 5 MG PO TABS
5.0000 mg | ORAL_TABLET | Freq: Every evening | ORAL | Status: DC | PRN
  Administered 2019-01-07: 5 mg via ORAL
  Filled 2019-01-07: qty 1

## 2019-01-07 MED ORDER — NICOTINE 21 MG/24HR TD PT24
21.0000 mg | MEDICATED_PATCH | Freq: Every day | TRANSDERMAL | Status: DC
  Administered 2019-01-08: 09:00:00 21 mg via TRANSDERMAL
  Filled 2019-01-07: qty 1

## 2019-01-07 NOTE — ED Notes (Signed)
PT BEING SET UP FOR [facility]

## 2019-01-07 NOTE — ED Notes (Signed)
Bed: WA29 Expected date:  Expected time:  Means of arrival:  Comments: 

## 2019-01-07 NOTE — BH Assessment (Addendum)
Tele Assessment Note   Patient Name: Robin Hodge MRN: [id number] Referring Physician: Madilyn Hook Location of Patient: [facility] ED Location of Provider: Behavioral Health TTS Department  Robin Hodge is an 33 y.o. female presenting voluntarily to [facility] ED complaining of suicidal ideation and thoughts of harming her children. Patient provides conflicting information from chart review and collateral information. Patient reports she was diagnosed with depression 4 years ago and she has been experiencing SI (without intent or plan) for the past week due to large amounts of stress. She states she recently lost her job and is a single mother to 4 children. She denies any prior suicide attempts. She states at one point being prescribed Zoloft but it was not helpful. She denies any current out patient resources. She reports thoughts of hurting her children "a month ago" due to stress (patient denies intent or plan). She states for this reason she has been staying with her brother for 4 days and visiting with her children. Children (age 68,6,11, 52) are with her mother. This is contradictory to information provided earlier to ED staff. Patient also denies any substance use, stating she is an occasional drinker and took some percocet the previous day for a tooth abscess. Patient's UDS is positive for cocaine, THC, opiates, and benzos. When this clinician confronted her on this patient continued to deny. She denies any current criminal charges. She reports an inability to sleep due to racing thoughts. Patient states she does not think that she needs to be hospitalized but that she needs to get some rest. She states she got a room alone at the [facility] for that reason. Per RN at [facility] patient stated "I want to go home but I know I need to be here." Patient may be minimizing symptoms, SI/HI, and substance use to be discharged from hospital. Patient gave verbal consent for TTS to contact her mother, Sharnise Port to obtain  collateral information.  Per collateral: Patient has been under a great deal of stress lately due to losing her job and "getting aggravated" with the kids. When this clinician asked about patient staying with her brother she replies that "She doesn't have a brother," adding further inconsistencies to assessment. She confirmed patient has made statements to her about wanting to hurt herself and the children. She does not believe patient would act on these thoughts.   Patient is alert and oriented x 4. She is dressed in scrubs and sitting up in chair. Her speech is logical, eye contact is appropriate, and thoughts are organized. Patient's mood is anxious and affect is congruent. She has poor insight, judgement, and impulse control. Patient does not appear to be responding to internal stimuli or experiencing delusional thought content.  Diagnosis: F33.2 MDD, recurrent, severe  Past Medical History:  Past Medical History:  Diagnosis Date  . Abnormal Pap smear    colpo-ok since  . Anemia   . Anxiety   . Depression   . GERD (gastroesophageal reflux disease)    with pregnancy  . Heart murmur    pt denies  . Obese     Past Surgical History:  Procedure Laterality Date  . DILATION AND CURETTAGE OF UTERUS    . LAPAROSCOPIC BILATERAL SALPINGECTOMY Bilateral 10/07/2014   Procedure: LAPAROSCOPIC BILATERAL SALPINGECTOMY;  Surgeon: Konrad Felix, MD;  Location: [facility];  Service: Gynecology;  Laterality: Bilateral;    Family History:  Family History  Problem Relation Age of Onset  . Hypertension Mother   .  Diabetes Mother   . Heart disease Mother   . Heart disease Father   . Other Neg Hx     Social History:  reports that she has been smoking cigarettes. She has a 0.75 pack-year smoking history. She has never used smokeless tobacco. She reports current alcohol use. She reports that she does not use drugs.  Additional Social History:  Alcohol / Drug Use Pain Medications: see  MAR Prescriptions: see MAR Over the Counter: see MAR Longest period of sobriety (when/how long): patient denies- however UDS is positive  CIWA: CIWA-Ar BP: (!) 156/114 Pulse Rate: 95 COWS:    Allergies: No Known Allergies  Home Medications: (Not in a hospital admission)   OB/GYN Status:  Patient's last menstrual period was 12/23/2018 (exact date).  General Assessment Data Assessment unable to be completed: Yes Reason for not completing assessment: patient being moved to [facility] Location of Assessment: [facility] ED TTS Assessment: In system Is this a Tele or Face-to-Face Assessment?: Tele Assessment Is this an Initial Assessment or a Re-assessment for this encounter?: Initial Assessment Patient Accompanied by:: N/A Language Other than English: No Living Arrangements: (currently living with friend) What gender do you identify as?: Female Marital status: Single Maiden name: Weidmann Pregnancy Status: No Living Arrangements: Non-relatives/Friends Can pt return to current living arrangement?: Yes Admission Status: Voluntary Is patient capable of signing voluntary admission?: Yes Referral Source: Self/Family/Friend Insurance type: None     Crisis Care Plan Living Arrangements: Non-relatives/Friends Legal Guardian: (self) Name of Psychiatrist: none Name of Therapist: none  Education Status Is patient currently in school?: No Is the patient employed, unemployed or receiving disability?: Unemployed  Risk to self with the past 6 months Suicidal Ideation: No-Not Currently/Within Last 6 Months Has patient been a risk to self within the past 6 months prior to admission? : Yes Suicidal Intent: No Has patient had any suicidal intent within the past 6 months prior to admission? : No Is patient at risk for suicide?: No Suicidal Plan?: No Has patient had any suicidal plan within the past 6 months prior to admission? : No Access to Means: No What has been your use of drugs/alcohol within  the last 12 months?: patient denies- UDS positive for THC, benzos, opiates, cocaine Previous Attempts/Gestures: No How many times?: 0 Other Self Harm Risks: none noted Triggers for Past Attempts: None known Intentional Self Injurious Behavior: None Family Suicide History: No Recent stressful life event(s): Job Loss, Financial Problems Persecutory voices/beliefs?: No Depression: Yes Depression Symptoms: Despondent, Insomnia, Tearfulness, Isolating, Fatigue, Guilt, Loss of interest in usual pleasures, Feeling worthless/self pity, Feeling angry/irritable Substance abuse history and/or treatment for substance abuse?: No Suicide prevention information given to non-admitted patients: Not applicable  Risk to Others within the past 6 months Homicidal Ideation: No-Not Currently/Within Last 6 Months Does patient have any lifetime risk of violence toward others beyond the six months prior to admission? : No Thoughts of Harm to Others: No-Not Currently Present/Within Last 6 Months Current Homicidal Intent: No-Not Currently/Within Last 6 Months Current Homicidal Plan: No-Not Currently/Within Last 6 Months Access to Homicidal Means: No Identified Victim: none noted History of harm to others?: No Assessment of Violence: On admission Violent Behavior Description: none noted Does patient have access to weapons?: No Criminal Charges Pending?: No Does patient have a court date: No Is patient on probation?: No  Psychosis Hallucinations: None noted Delusions: None noted  Mental Status Report Appearance/Hygiene: In scrubs Eye Contact: Fair Motor Activity: Freedom of movement Speech: Logical/coherent Level of  Consciousness: Alert Mood: Anxious Affect: Anxious Anxiety Level: Moderate Thought Processes: Coherent, Relevant Judgement: Impaired Orientation: Person, Place, Time, Situation Obsessive Compulsive Thoughts/Behaviors: None  Cognitive Functioning Concentration: Normal Memory: Recent  Intact, Remote Intact Is patient IDD: No Insight: Poor Impulse Control: Poor Appetite: Good Have you had any weight changes? : No Change Sleep: Decreased Total Hours of Sleep: 2 Vegetative Symptoms: None  ADLScreening [facility] Assessment Services) Patient's cognitive ability adequate to safely complete daily activities?: Yes Patient able to express need for assistance with ADLs?: Yes Independently performs ADLs?: Yes (appropriate for developmental age)  Prior Inpatient Therapy Prior Inpatient Therapy: No  Prior Outpatient Therapy Prior Outpatient Therapy: Yes Prior Therapy Dates: 2016 Prior Therapy Facilty/Provider(s): (unknown) Reason for Treatment: depression Does patient have an ACCT team?: No Does patient have Intensive In-House Services?  : No Does patient have [facility] services? : No Does patient have P4CC services?: No  ADL Screening (condition at time of admission) Patient's cognitive ability adequate to safely complete daily activities?: Yes Is the patient deaf or have difficulty hearing?: No Does the patient have difficulty seeing, even when wearing glasses/contacts?: No Does the patient have difficulty concentrating, remembering, or making decisions?: No Patient able to express need for assistance with ADLs?: Yes Does the patient have difficulty dressing or bathing?: No Independently performs ADLs?: Yes (appropriate for developmental age) Does the patient have difficulty walking or climbing stairs?: No Weakness of Legs: None Weakness of Arms/Hands: None  Home Assistive Devices/Equipment Home Assistive Devices/Equipment: None  Therapy Consults (therapy consults require a physician order) PT Evaluation Needed: No OT Evalulation Needed: No SLP Evaluation Needed: No Abuse/Neglect Assessment (Assessment to be complete while patient is alone) Abuse/Neglect Assessment Can Be Completed: Yes Physical Abuse: Denies Verbal Abuse: Denies Sexual Abuse: Denies Exploitation  of patient/patient's resources: Denies Self-Neglect: Denies Values / Beliefs Cultural Requests During Hospitalization: None Spiritual Requests During Hospitalization: None Consults Spiritual Care Consult Needed: No Social Work Consult Needed: No Merchant navy officer (For Healthcare) Does Patient Have a Medical Advance Directive?: No Would patient like information on creating a medical advance directive?: No - Patient declined          Disposition: Malachy Chamber, PMHNP recommends in patient treatment. Disposition Initial Assessment Completed for this Encounter: Yes  This service was provided via telemedicine using a 2-way, interactive audio and video technology.  Names of all persons participating in this telemedicine service and their role in this encounter. Name: Celedonio Miyamoto, [place] Role: TTS  Name: Lorel Monaco Role: provider  Name:  Role:   Name:  Role:     Celedonio Miyamoto 01/07/2019 4:21 PM

## 2019-01-07 NOTE — ED Provider Notes (Signed)
IVC paperwork completed. Patient's BP is noted to be slightly high.  She reports that she was on antihypertensive at one point, but does not recall what she was taking.  For now we will just monitor her BP.   Derwood Kaplan, MD 01/07/19 2129

## 2019-01-07 NOTE — ED Provider Notes (Signed)
[facility]-EMERGENCY DEPT Provider Note   CSN: [id number] Arrival date & time: 01/07/19  1350    History   Chief Complaint Chief Complaint  Patient presents with  . Suicidal    HPI Robin Hodge is a 33 y.o. female with a hx of tobacco abuse, anxiety, depression, and GERD who presents to the ED with SI. Patient states she has struggled with depression intermittently for years, was on medication at one point, but feels this made it worse and has not taken anti-depressants for > 3 years. She notes that over the past week or so she has developed thoughts of harming herself and harming her children. No specific plan. She has removed herself from her home over the past 1 week to avoid doing such harm. She recently los her job. No other alleviating/aggravaging factors. Denies hallucinations. Denies fever, chills, emesis, diarrhea, chest pain, dyspnea, or abdominal pain.      HPI  Past Medical History:  Diagnosis Date  . Abnormal Pap smear    colpo-ok since  . Anemia   . Anxiety   . Depression   . GERD (gastroesophageal reflux disease)    with pregnancy  . Heart murmur    pt denies  . Obese     Patient Active Problem List   Diagnosis Date Noted  . Vaginal delivery 08/11/2014  . Susceptible to varicella (non-immune), currently pregnant 04/10/2014  . Rh negative state in antepartum period 04/10/2014    Past Surgical History:  Procedure Laterality Date  . DILATION AND CURETTAGE OF UTERUS    . LAPAROSCOPIC BILATERAL SALPINGECTOMY Bilateral 10/07/2014   Procedure: LAPAROSCOPIC BILATERAL SALPINGECTOMY;  Surgeon: Konrad Felix, MD;  Location: [facility];  Service: Gynecology;  Laterality: Bilateral;     OB History    Gravida  6   Para  4   Term  4   Preterm      AB  2   Living  4     SAB  2   TAB  0   Ectopic      Multiple  0   Live Births  4            Home Medications    Prior to Admission medications   Medication Sig Start  Date End Date Taking? Authorizing Provider  amoxicillin-clavulanate (AUGMENTIN) 875-125 MG per tablet Take 1 tablet by mouth every 12 (twelve) hours. Patient not taking: Reported on 07/12/2018 04/09/15   Riki Sheer, PA-C  ciprofloxacin (CIPRO) 500 MG tablet Take 1 tablet (500 mg total) by mouth 2 (two) times daily. One po bid x 7 days 07/12/18   Geoffery Lyons, MD  diclofenac (VOLTAREN) 75 MG EC tablet 1 tablet every 12 hours for pain and inflammation Patient not taking: Reported on 07/12/2018 04/09/15   Riki Sheer, PA-C  diphenhydrAMINE (BENADRYL) 25 mg capsule Take 50 mg by mouth at bedtime as needed for allergies.    [provider]  fluticasone (FLONASE) 50 MCG/ACT nasal spray Place 2 sprays into both nostrils daily. Patient not taking: Reported on 07/12/2018 04/03/17   Loren Racer, MD  HYDROcodone-acetaminophen (NORCO/VICODIN) 5-325 MG per tablet Take 1-2 tablets by mouth every 6 (six) hours as needed for moderate pain. Patient not taking: Reported on 07/12/2018 04/09/15   Riki Sheer, PA-C  ibuprofen (ADVIL,MOTRIN) 200 MG tablet Take 800 mg by mouth every 6 (six) hours as needed for moderate pain.    [provider]  ibuprofen (ADVIL,MOTRIN) 600 MG  tablet Take 1 tablet (600 mg total) by mouth every 6 (six) hours as needed for fever, headache or moderate pain. Patient not taking: Reported on 07/12/2018 04/03/17   Loren Racer, MD  levofloxacin (LEVAQUIN) 750 MG tablet Take 1 tablet (750 mg total) by mouth daily. Patient not taking: Reported on 07/12/2018 04/04/17   Loren Racer, MD  loratadine (CLARITIN) 10 MG tablet Take 1 tablet (10 mg total) by mouth daily. Patient not taking: Reported on 07/12/2018 04/03/17   Loren Racer, MD  NIFEdipine (PROCARDIA-XL/ADALAT CC) 30 MG 24 hr tablet Take 1 tablet (30 mg total) by mouth daily. Patient not taking: Reported on 07/12/2018 08/13/14   Standard, Venus, CNM  oxyCODONE-acetaminophen (PERCOCET/ROXICET) 5-325 MG per  tablet Take 1-2 tablets by mouth every 4 (four) hours as needed for severe pain. Patient not taking: Reported on 07/12/2018 08/13/14   Nigel Bridgeman, CNM  oxyCODONE-acetaminophen (ROXICET) 5-325 MG per tablet Take 1 tablet by mouth every 4 (four) hours as needed for severe pain. Patient not taking: Reported on 07/12/2018 10/07/14   Hoover Browns, MD  predniSONE (STERAPRED UNI-PAK 48 TAB) 10 MG (48) TBPK tablet Take by mouth daily. Patient not taking: Reported on 07/12/2018 02/18/15   Rodolph Bong, MD    Family History Family History  Problem Relation Age of Onset  . Hypertension Mother   . Diabetes Mother   . Heart disease Mother   . Heart disease Father   . Other Neg Hx     Social History Social History   Tobacco Use  . Smoking status: Current Every Day Smoker    Packs/day: 0.25    Years: 3.00    Pack years: 0.75    Types: Cigarettes    Last attempt to quit: 09/25/2013    Years since quitting: 5.2  . Smokeless tobacco: Never Used  Substance Use Topics  . Alcohol use: Yes    Comment: occasionally  . Drug use: No     Allergies   Patient has no known allergies.   Review of Systems Review of Systems  Constitutional: Negative for chills and fever.  Respiratory: Negative for shortness of breath.   Cardiovascular: Negative for chest pain.  Gastrointestinal: Negative for abdominal pain, diarrhea and vomiting.  Psychiatric/Behavioral: Positive for suicidal ideas. Negative for hallucinations.  All other systems reviewed and are negative.  Physical Exam Updated Vital Signs BP (!) 178/97 (BP Location: Left Arm)   Pulse 95   Temp 98.3 F (36.8 C) (Oral)   Ht  (1.702 m)   Wt (!) 142.9 kg   LMP 12/23/2018 (Exact Date)   SpO2 98%   BMI 49.34 kg/m  RR: 16  Physical Exam Vitals signs and nursing note reviewed.  Constitutional:      General: She is not in acute distress.    Appearance: She is well-developed. She is not toxic-appearing.  HENT:     Head: Normocephalic  and atraumatic.  Eyes:     General:        Right eye: No discharge.        Left eye: No discharge.     Conjunctiva/sclera: Conjunctivae normal.  Neck:     Musculoskeletal: Neck supple.  Cardiovascular:     Rate and Rhythm: Normal rate and regular rhythm.  Pulmonary:     Effort: Pulmonary effort is normal. No respiratory distress.     Breath sounds: Normal breath sounds. No wheezing, rhonchi or rales.  Abdominal:     General: There is no distension.  Palpations: Abdomen is soft.     Tenderness: There is no abdominal tenderness.  Skin:    General: Skin is warm and dry.     Findings: No rash.  Neurological:     General: No focal deficit present.     Mental Status: She is alert.     Comments: Clear speech.   Psychiatric:        Attention and Perception: She does not perceive auditory or visual hallucinations.        Thought Content: Thought content includes homicidal and suicidal ideation. Thought content does not include homicidal or suicidal plan.      ED Treatments / Results  Labs (all labs ordered are listed, but only abnormal results are displayed) Labs Reviewed  COMPREHENSIVE METABOLIC PANEL - Abnormal; Notable for the following components:      Result Value   Glucose, Bld 106 (*)    Calcium 8.4 (*)    All other components within normal limits  ACETAMINOPHEN LEVEL - Abnormal; Notable for the following components:   Acetaminophen (Tylenol), Serum <10 (*)    All other components within normal limits  CBC - Abnormal; Notable for the following components:   RBC 3.86 (*)    Hemoglobin 8.4 (*)    HCT 28.3 (*)    MCV 73.3 (*)    MCH 21.8 (*)    MCHC 29.7 (*)    RDW 19.0 (*)    Platelets 551 (*)    All other components within normal limits  RAPID URINE DRUG SCREEN, HOSP PERFORMED - Abnormal; Notable for the following components:   Opiates POSITIVE (*)    Cocaine POSITIVE (*)    Benzodiazepines POSITIVE (*)    Tetrahydrocannabinol POSITIVE (*)    All other  components within normal limits  ETHANOL  SALICYLATE LEVEL  I-STAT BETA HCG BLOOD, ED ([facility], [facility], [facility] ONLY)    EKG None  Radiology No results found.  Procedures Procedures (including critical care time)  Medications Ordered in ED Medications - No data to display   Initial Impression / Assessment and Plan / ED Course  I have reviewed the triage vital signs and the nursing notes.  Pertinent labs & imaging results that were available during my care of the patient were reviewed by me and considered in my medical decision making (see chart for details).   Patient presents to the ED with SI/HI requiring medical clearance. Nontoxic appearing, vitals WNL with the exception of HTN- hx of somewhat similar on chart review, patient states she has been told she should be on BP meds but not currently prescribed any, doubt HTN emergency, PCP recheck.   Screening labs reviewed: CBC: Anemia & elevated platelets consistent with prior.  CMP: Mild hypocalcemia @ 8.4. Otherwise unremarkable.  Preg test: Negative UDS: Positive for opiates, cocaine, benzos, and tetrahydrocannibinol EtOH, salicylate, and acetaminophen levels: WNL  Labs appear consistent w/ prior without acute abnormality.  Patient medically cleared for TTS evaluation. Holding orders & home meds placed. Disposition per [facility].   Final Clinical Impressions(s) / ED Diagnoses   Final diagnoses:  Suicidal ideation    ED Discharge Orders    None       Cherly Anderson, PA-C 01/07/19 1520    Tilden Fossa, MD 01/07/19 [phone]

## 2019-01-07 NOTE — ED Notes (Signed)
PT AWARE SHE WILL BE STAY. PT REQUEST TO TAKE A SHOWER.

## 2019-01-07 NOTE — ED Notes (Signed)
Pt alert and oriented. Pt c/o tooth pain. Pt denies any HI or SI at this time. Pt reports that she just needs to sleep. Pt cooperative and calm. Pt denies any avh at this time. Pt safe will continue to monitor.

## 2019-01-07 NOTE — ED Notes (Signed)
PT REQUESTING TO LEAVE HOWEVER KNOWS SHE NEEDS TO BE HERE. ENCOURAGED TO STAY. PT AWARE SHE HAS ELEVATED BP. DOES NOT TAKE HER MEDICATIONS. STATES SHE CANNOT AFFORD THEM. PT DOES NOT HAVE ANY SYMPTOMS AT THIS TIME.

## 2019-01-07 NOTE — ED Triage Notes (Signed)
Patient states she has been feeling suicidal and had thoughts of physically hurting her kids. Patient states she stayed away from her kids x 4 days so she would not hurt them. Patient denies having a plan on killing herself.  Patient denies any hallucinations. Patient denies any drugs or alcohol use today.  Patient states she has always felt depressed and sad since her youngest son was born. Patient states she was on meds,but stopped because she did not think they were helping. Patient also states that she recently lost her job.

## 2019-01-07 NOTE — ED Notes (Signed)
Bed: [facility]PT4 Expected date:  Expected time:  Means of arrival:  Comments: 

## 2019-01-07 NOTE — ED Notes (Signed)
Dr Rhunette Croft, made aware of patient blood pressure.

## 2019-01-07 NOTE — ED Notes (Signed)
Bed: [facility]PT3 Expected date:  Expected time:  Means of arrival:  Comments: 

## 2019-01-08 DIAGNOSIS — F918 Other conduct disorders: Secondary | ICD-10-CM

## 2019-01-08 DIAGNOSIS — F1910 Other psychoactive substance abuse, uncomplicated: Secondary | ICD-10-CM

## 2019-01-08 MED ORDER — IBUPROFEN 800 MG PO TABS
800.0000 mg | ORAL_TABLET | Freq: Once | ORAL | Status: AC
  Administered 2019-01-08: 800 mg via ORAL
  Filled 2019-01-08: qty 1

## 2019-01-08 NOTE — Consult Note (Signed)
[facility] Psych ED Discharge  01/08/2019 11:32 AM Robin Hodge  MRN:  [id number] Principal Problem: Polysubstance abuse [facility]) Discharge Diagnoses: Principal Problem:   Polysubstance abuse (HCC)   Subjective:  Per chart review, patient was admitted with SI and thoughts to physically harm her children. Today, she reports feeling better. She admits to using ecstasy prior to hospitalization. She reports that someone told her that she was given this substance although she was unaware that she had ingested it. UDS is positive for benzodiazepines, opiates, cocaine and THC. She denies cocaine use and reports that she used marijuana once with her brother. She appears to minimize her substance use. She denies SI, HI or AVH. She attributes her previous thoughts to substance use. She reports that she isolated herself to a hotel room for 2 days while she felt this way. She reports a history of postpartum depression after she had her 73 y/o child. She briefly took Zoloft. She feels safe to discharge home. Collateral was previously obtained and there is no concern for herself to self or others.   Total Time spent with patient: 30 minutes  Past Psychiatric History: Depression and anxiety.   Past Medical History:  Past Medical History:  Diagnosis Date  . Abnormal Pap smear    colpo-ok since  . Anemia   . Anxiety   . Depression   . GERD (gastroesophageal reflux disease)    with pregnancy  . Heart murmur    pt denies  . Obese     Past Surgical History:  Procedure Laterality Date  . DILATION AND CURETTAGE OF UTERUS    . LAPAROSCOPIC BILATERAL SALPINGECTOMY Bilateral 10/07/2014   Procedure: LAPAROSCOPIC BILATERAL SALPINGECTOMY;  Surgeon: Konrad Felix, MD;  Location: [facility];  Service: Gynecology;  Laterality: Bilateral;   Family History:  Family History  Problem Relation Age of Onset  . Hypertension Mother   . Diabetes Mother   . Heart disease Mother   . Heart disease Father   . Other Neg Hx     Family Psychiatric  History: Denies  Social History:  Social History   Substance and Sexual Activity  Alcohol Use Yes   Comment: occasionally     Social History   Substance and Sexual Activity  Drug Use No    Social History   Socioeconomic History  . Marital status: Single    Spouse name: Not on file  . Number of children: Not on file  . Years of education: Not on file  . Highest education level: Not on file  Occupational History  . Not on file  Social Needs  . Financial resource strain: Not on file  . Food insecurity:    Worry: Not on file    Inability: Not on file  . Transportation needs:    Medical: Not on file    Non-medical: Not on file  Tobacco Use  . Smoking status: Current Every Day Smoker    Packs/day: 0.25    Years: 3.00    Pack years: 0.75    Types: Cigarettes    Last attempt to quit: 09/25/2013    Years since quitting: 5.2  . Smokeless tobacco: Never Used  Substance and Sexual Activity  . Alcohol use: Yes    Comment: occasionally  . Drug use: No  . Sexual activity: Yes    Birth control/protection: None  Lifestyle  . Physical activity:    Days per week: Not on file    Minutes per session: Not on file  .  Stress: Not on file  Relationships  . Social connections:    Talks on phone: Not on file    Gets together: Not on file    Attends religious service: Not on file    Active member of club or organization: Not on file    Attends meetings of clubs or organizations: Not on file    Relationship status: Not on file  Other Topics Concern  . Not on file  Social History Narrative  . Not on file    Has this patient used any form of tobacco in the last 30 days? (Cigarettes, Smokeless Tobacco, Cigars, and/or Pipes) Prescription not provided because: N/A  Current Medications: Current Facility-Administered Medications  Medication Dose Route Frequency Provider Last Rate Last Dose  . acetaminophen (TYLENOL) tablet 650 mg  650 mg Oral Q4H PRN  Petrucelli, Samantha R, PA-C   650 mg at 01/08/19 0120  . alum & mag hydroxide-simeth (MAALOX/MYLANTA) 200-200-20 MG/5ML suspension 30 mL  30 mL Oral Q6H PRN Petrucelli, Samantha R, PA-C      . diphenhydrAMINE (BENADRYL) capsule 50 mg  50 mg Oral QHS PRN Petrucelli, Samantha R, PA-C      . nicotine (NICODERM CQ - dosed in mg/24 hours) patch 21 mg  21 mg Transdermal Daily Petrucelli, Samantha R, PA-C   21 mg at 01/08/19 0911  . ondansetron (ZOFRAN) tablet 4 mg  4 mg Oral Q8H PRN Petrucelli, Samantha R, PA-C      . ziprasidone (GEODON) injection 20 mg  20 mg Intramuscular Once PRN Rhunette Croft, Ankit, MD      . zolpidem (AMBIEN) tablet 5 mg  5 mg Oral QHS PRN Petrucelli, Samantha R, PA-C   5 mg at 01/07/19 2103   Current Outpatient Medications  Medication Sig Dispense Refill  . ibuprofen (ADVIL,MOTRIN) 200 MG tablet Take 600 mg by mouth every 6 (six) hours as needed for moderate pain (tooth ache).    . diphenhydrAMINE (BENADRYL) 25 mg capsule Take 50 mg by mouth at bedtime as needed for allergies.     PTA Medications: (Not in a hospital admission)   Musculoskeletal: Strength & Muscle Tone: within normal limits Gait & Station: UTA since patient is lying in bed. Patient leans: N/A  Psychiatric Specialty Exam: Physical Exam  Nursing note and vitals reviewed. Constitutional: She is oriented to person, place, and time. She appears well-developed and well-nourished.  HENT:  Head: Normocephalic and atraumatic.  Neck: Normal range of motion.  Respiratory: Effort normal.  Musculoskeletal: Normal range of motion.  Neurological: She is alert and oriented to person, place, and time.  Psychiatric: She has a normal mood and affect. Her speech is normal and behavior is normal. Judgment and thought content normal. Cognition and memory are normal.    Review of Systems  Psychiatric/Behavioral: Positive for substance abuse. Negative for depression, hallucinations and suicidal ideas.  All other systems  reviewed and are negative.   Blood pressure (!) 158/104, pulse 85, temperature 98.7 F (37.1 C), temperature source Oral, resp. rate 20, height  (1.702 m), weight (!) 142.9 kg, last menstrual period 12/23/2018, SpO2 100 %, unknown if currently breastfeeding.Body mass index is 49.34 kg/m.  General Appearance: Fairly Groomed, young, African American female, wearing paper hospital scrubs and sitting in bed. NAD.   Eye Contact:  Good  Speech:  Clear and Coherent and Normal Rate  Volume:  Normal  Mood:  Euthymic  Affect:  Appropriate and Congruent  Thought Process:  Goal Directed, Linear and Descriptions of Associations:  Intact  Orientation:  Full (Time, Place, and Person)  Thought Content:  Logical  Suicidal Thoughts:  No  Homicidal Thoughts:  No  Memory:  Immediate;   Good Recent;   Good Remote;   Good  Judgement:  Fair  Insight:  Fair  Psychomotor Activity:  Normal  Concentration:  Concentration: Good and Attention Span: Good  Recall:  Good  Fund of Knowledge:  Good  Language:  Good  Akathisia:  No  Handed:  Right  AIMS (if indicated):   N/A  Assets:  Communication Skills Desire for Improvement Housing Physical Health Resilience Social Support  ADL's:  Intact  Cognition:  WNL  Sleep:   N/A     Demographic Factors:  Unemployed  Loss Factors: Financial problems/change in socioeconomic status  Historical Factors: Impulsivity  Risk Reduction Factors:   Living with another person, especially a relative and Positive social support  Continued Clinical Symptoms:  More than one psychiatric diagnosis Previous Psychiatric Diagnoses and Treatments  Cognitive Features That Contribute To Risk:  None    Suicide Risk:  Minimal: No identifiable suicidal ideation.  Patients presenting with no risk factors but with morbid ruminations; may be classified as minimal risk based on the severity of the depressive symptoms  Assessment:  Robin Hodge is a 33 y.o. female who  was admitted with SI and thoughts to harm family. Today, she is appropriate in behavior and thought process. She admits to ecstasy use which lead to prior thoughts. She denies SI, HI or AVH today. She is future oriented and reports the need to pay her rent on discharge. She will be provided with outpatient mental health services. She does not warrant inpatient psychiatric hospitalization at this time.   Plan Of Care/Follow-up recommendations:  -Patient will be provided with outpatient mental health services.   Disposition: Discharge home.  Cherly Beach, DO 01/08/2019, 11:32 AM

## 2019-01-08 NOTE — BH Assessment (Signed)
[facility] Assessment Progress Note  Per Juanetta Beets, DO, this pt does not require psychiatric hospitalization at this time.  Pt presents under IVC initiated by EDP Derwood Kaplan, MD, which Dr Sharma Covert has rescinded.  Pt is to be discharged from [facility] with recommendation to follow up with [facility].  This has been included in pt's discharge instructions.  Pt's nurse has been notified.  Doylene Canning, MA Triage Specialist [phone]

## 2019-01-08 NOTE — ED Notes (Signed)
RN reviewed discharge instructions with patient. All belongings returned to patient. Patient reports she is calling a ride and they will be coming to get her. Pt alert and oriented and discharged in no active distress.

## 2019-01-08 NOTE — ED Notes (Signed)
Dr. Blinda Leatherwood made aware of patient hemoglobin of 8.4.

## 2019-01-08 NOTE — Discharge Instructions (Signed)
For your behavioral health needs, you are advised to follow up with [facility].  Call them at your earliest opportunity to schedule an intake appointment;       [facility]      [address]      [place], [place] [address]      [phone]; select Option 5

## 2019-08-04 ENCOUNTER — Encounter (HOSPITAL_COMMUNITY): Payer: Self-pay | Admitting: Emergency Medicine

## 2019-08-04 ENCOUNTER — Emergency Department (HOSPITAL_COMMUNITY)
Admission: EM | Admit: 2019-08-04 | Discharge: 2019-08-05 | Disposition: A | Discharge status: Home / Self Care | Payer: Self-pay | Attending: Emergency Medicine | Admitting: Emergency Medicine

## 2019-08-04 ENCOUNTER — Other Ambulatory Visit: Payer: Self-pay

## 2019-08-04 DIAGNOSIS — Z5321 Procedure and treatment not carried out due to patient leaving prior to being seen by health care provider: Secondary | ICD-10-CM | POA: Insufficient documentation

## 2019-08-04 LAB — URINALYSIS, ROUTINE W REFLEX MICROSCOPIC
Bilirubin Urine: NEGATIVE
Glucose, UA: NEGATIVE mg/dL
Hgb urine dipstick: NEGATIVE
Ketones, ur: 15 mg/dL — AB
Nitrite: POSITIVE — AB
Protein, ur: NEGATIVE mg/dL
Specific Gravity, Urine: >1.03 — ABNORMAL HIGH (ref 1.005–1.030)
pH: 6 (ref 5.0–8.0)

## 2019-08-04 LAB — POC URINE PREG, ED: Preg Test, Ur: NEGATIVE

## 2019-08-04 LAB — URINALYSIS, MICROSCOPIC (REFLEX)

## 2019-08-04 NOTE — ED Triage Notes (Signed)
Patient c/o malodorous urine and lower pelvis pressure. Denies any dysuria or flank pain. Denies any fevers or discharge.

## 2019-08-05 NOTE — ED Notes (Signed)
No answer x1 for vitals 

## 2019-08-05 NOTE — ED Notes (Signed)
No answer

## 2019-08-18 ENCOUNTER — Other Ambulatory Visit: Payer: Self-pay

## 2019-08-18 ENCOUNTER — Inpatient Hospital Stay (HOSPITAL_COMMUNITY)
Admission: AD | Admit: 2019-08-18 | Discharge: 2019-08-18 | Disposition: A | Discharge status: Home / Self Care | Payer: Self-pay | Attending: Obstetrics and Gynecology | Admitting: Obstetrics and Gynecology

## 2019-08-18 ENCOUNTER — Emergency Department (HOSPITAL_COMMUNITY)
Admission: EM | Admit: 2019-08-18 | Discharge: 2019-08-18 | Disposition: A | Discharge status: Home / Self Care | Payer: Self-pay | Attending: Emergency Medicine | Admitting: Emergency Medicine

## 2019-08-18 DIAGNOSIS — R109 Unspecified abdominal pain: Secondary | ICD-10-CM | POA: Insufficient documentation

## 2019-08-18 DIAGNOSIS — F17210 Nicotine dependence, cigarettes, uncomplicated: Secondary | ICD-10-CM | POA: Insufficient documentation

## 2019-08-18 DIAGNOSIS — Z9851 Tubal ligation status: Secondary | ICD-10-CM | POA: Insufficient documentation

## 2019-08-18 DIAGNOSIS — B9689 Other specified bacterial agents as the cause of diseases classified elsewhere: Secondary | ICD-10-CM | POA: Insufficient documentation

## 2019-08-18 DIAGNOSIS — N3000 Acute cystitis without hematuria: Secondary | ICD-10-CM | POA: Insufficient documentation

## 2019-08-18 DIAGNOSIS — R300 Dysuria: Secondary | ICD-10-CM | POA: Insufficient documentation

## 2019-08-18 DIAGNOSIS — N760 Acute vaginitis: Secondary | ICD-10-CM | POA: Insufficient documentation

## 2019-08-18 LAB — URINALYSIS, MICROSCOPIC (REFLEX): RBC / HPF: NONE SEEN RBC/hpf (ref 0–5)

## 2019-08-18 LAB — URINALYSIS, ROUTINE W REFLEX MICROSCOPIC
Glucose, UA: NEGATIVE mg/dL
Hgb urine dipstick: NEGATIVE
Ketones, ur: 15 mg/dL — AB
Nitrite: POSITIVE — AB
Protein, ur: NEGATIVE mg/dL
Specific Gravity, Urine: >1.03 — ABNORMAL HIGH (ref 1.005–1.030)
pH: 5.5 (ref 5.0–8.0)

## 2019-08-18 LAB — WET PREP, GENITAL
Sperm: NONE SEEN
Trich, Wet Prep: NONE SEEN
Yeast Wet Prep HPF POC: NONE SEEN

## 2019-08-18 LAB — HIV ANTIBODY (ROUTINE TESTING W REFLEX): HIV Screen 4th Generation wRfx: NONREACTIVE

## 2019-08-18 LAB — PREGNANCY, URINE: Preg Test, Ur: NEGATIVE

## 2019-08-18 MED ORDER — CEPHALEXIN 500 MG PO CAPS: 500.0000 mg | ORAL_CAPSULE | Freq: Four times a day (QID) | ORAL | 0 refills | Status: AC

## 2019-08-18 MED ORDER — METRONIDAZOLE 500 MG PO TABS: 500.0000 mg | ORAL_TABLET | Freq: Two times a day (BID) | ORAL | 0 refills | Status: AC

## 2019-08-18 MED ORDER — CEFTRIAXONE SODIUM 250 MG IJ SOLR
250.0000 mg | Freq: Once | INTRAMUSCULAR | Status: AC
  Administered 2019-08-18: 250 mg via INTRAMUSCULAR
  Filled 2019-08-18: qty 250

## 2019-08-18 MED ORDER — AZITHROMYCIN 250 MG PO TABS
1000.0000 mg | ORAL_TABLET | Freq: Once | ORAL | Status: AC
  Administered 2019-08-18: 1000 mg via ORAL
  Filled 2019-08-18: qty 4

## 2019-08-18 MED ORDER — STERILE WATER FOR INJECTION IJ SOLN
INTRAMUSCULAR | Status: AC
  Administered 2019-08-18: 17:00:00 0.9 mL
  Filled 2019-08-18: qty 10

## 2019-08-18 NOTE — ED Notes (Signed)
Was able to contact Robin Hodge- understands to come back to pick d/c papers and where her prescriptions are.

## 2019-08-18 NOTE — MAU Provider Note (Signed)
Chief Complaint: Dysuria and Abdominal Pain   First Provider Initiated Contact with Patient 08/18/19 1324      SUBJECTIVE HPI: Robin Hodge is a 33 y.o. [id number] who presents to maternity admissions for dysuria. She is not pregnant. Hx of tubal ligation. Not concerned about pregnancy at this time.   Past Medical History:  Diagnosis Date  . Abnormal Pap smear    colpo-ok since  . Anemia   . Anxiety   . Depression   . GERD (gastroesophageal reflux disease)    with pregnancy  . Heart murmur    pt denies  . Obese    Past Surgical History:  Procedure Laterality Date  . DILATION AND CURETTAGE OF UTERUS    . LAPAROSCOPIC BILATERAL SALPINGECTOMY Bilateral 10/07/2014   Procedure: LAPAROSCOPIC BILATERAL SALPINGECTOMY;  Surgeon: Konrad Felix, MD;  Location: [facility];  Service: Gynecology;  Laterality: Bilateral;   Social History   Socioeconomic History  . Marital status: Single    Spouse name: Not on file  . Number of children: Not on file  . Years of education: Not on file  . Highest education level: Not on file  Occupational History  . Not on file  Social Needs  . Financial resource strain: Not on file  . Food insecurity    Worry: Not on file    Inability: Not on file  . Transportation needs    Medical: Not on file    Non-medical: Not on file  Tobacco Use  . Smoking status: Current Every Day Smoker    Packs/day: 0.25    Years: 3.00    Pack years: 0.75    Types: Cigarettes    Last attempt to quit: 09/25/2013    Years since quitting: 5.8  . Smokeless tobacco: Never Used  Substance and Sexual Activity  . Alcohol use: Yes    Comment: occasionally  . Drug use: No  . Sexual activity: Yes    Birth control/protection: None  Lifestyle  . Physical activity    Days per week: Not on file    Minutes per session: Not on file  . Stress: Not on file  Relationships  . Social Musician on phone: Not on file    Gets together: Not on file    Attends religious  service: Not on file    Active member of club or organization: Not on file    Attends meetings of clubs or organizations: Not on file    Relationship status: Not on file  . Intimate partner violence    Fear of current or ex partner: Not on file    Emotionally abused: Not on file    Physically abused: Not on file    Forced sexual activity: Not on file  Other Topics Concern  . Not on file  Social History Narrative  . Not on file   No current facility-administered medications on file prior to encounter.    Current Outpatient Medications on File Prior to Encounter  Medication Sig Dispense Refill  . diphenhydrAMINE (BENADRYL) 25 mg capsule Take 50 mg by mouth at bedtime as needed for allergies.    Marland Kitchen ibuprofen (ADVIL,MOTRIN) 200 MG tablet Take 600 mg by mouth every 6 (six) hours as needed for moderate pain (tooth ache).     No Known Allergies  ROS:  Review of Systems  Constitutional: Negative for fever.  Genitourinary: Positive for dysuria.    I have reviewed patient's Past Medical Hx, Surgical Hx, Family Hx,  Social Hx, medications and allergies.   Physical Exam   Patient Vitals for the past 24 hrs:  BP Temp Temp src Pulse Resp SpO2 Weight  08/18/19 1312 (!) 132/92 98.5 F (36.9 C) Oral 95 18 100 % (!) 137.9 kg   Physical Exam  Constitutional: She appears well-developed and well-nourished. No distress.  Skin: She is not diaphoretic.    MDM Patient denies any concerning symptoms in need of emergent evaluation. Patient discharged home. She was given a list of places to seek non-emergent evaluation of her complaint at [facility], Urgent care or her PCP.  She was informed of the walk in hours tonight from 4-8 in our office at [facility]  PLAN Discharge patient at her request to seek non-emergent medical care elsewhere  Lezlie Lye, NP 08/18/2019 6:21 PM

## 2019-08-18 NOTE — ED Notes (Signed)
Went into to d/c pt and she was gone with all belonging. Will call pt because she needs to pick medication.

## 2019-08-18 NOTE — ED Triage Notes (Signed)
Pt here to seen seen for possible uti burning with urination and pressure- recent uti. Wants to be tested for STDs.

## 2019-08-18 NOTE — MAU Note (Signed)
Thinks she has a UTI, feeling a lot of pressure when she uses the restroom.  Bloated and cramping.  Doesn't believe she is preg, has had a tubal ligation. 'the guy she was talking too... he had been to the hosp, everything said neg, but she wants to get tested for everything.".

## 2019-08-18 NOTE — ED Provider Notes (Signed)
[facility] EMERGENCY DEPARTMENT Provider Note   CSN: [id number] Arrival date & time: 08/18/19  1345     History   Chief Complaint Chief Complaint  Patient presents with   Recurrent UTI    HPI Robin Hodge is a 33 y.o. female past medical history of abnormal Pap smear, GERD, depression who presents for evaluation with dysuria, urinary hesitancy, increased urinary frequency that is been ongoing for last 2 weeks.  States that she was concerned that she had a UTI and actually came to the ED but not wait to be seen.  Patient states that she continues to have symptoms as well as feeling that her urine is malodorous.  She states that over the last couple days, she has developed some left lower back and lower abdominal pain.  Denies any nausea/vomiting, fevers.  She has tried cranberry pills and ibuprofen with no improvement in symptoms.  Patient states she is also concerned about possible STDs.  She states that her significant other was seen in the ED for evaluation of STDs and was treated.  She states that his results came back negative but she is concerned about possible STD infection.  She she has not noted any vaginal discharge.  She denies any vaginal bleeding.  Her last menstrual cycle was 07/22/2019.  Patient with history of tubal ligation.      The history is provided by the patient.    Past Medical History:  Diagnosis Date   Abnormal Pap smear    colpo-ok since   Anemia    Anxiety    Depression    GERD (gastroesophageal reflux disease)    with pregnancy   Heart murmur    pt denies   Obese     Patient Active Problem List   Diagnosis Date Noted   Polysubstance abuse (HCC) 01/08/2019   Vaginal delivery 08/11/2014   Susceptible to varicella (non-immune), currently pregnant 04/10/2014   Rh negative state in antepartum period 04/10/2014    Past Surgical History:  Procedure Laterality Date   DILATION AND CURETTAGE OF UTERUS      LAPAROSCOPIC BILATERAL SALPINGECTOMY Bilateral 10/07/2014   Procedure: LAPAROSCOPIC BILATERAL SALPINGECTOMY;  Surgeon: Konrad Felix, MD;  Location: [facility];  Service: Gynecology;  Laterality: Bilateral;     OB History    Gravida  6   Para  4   Term  4   Preterm      AB  2   Living  4     SAB  2   TAB  0   Ectopic      Multiple  0   Live Births  4            Home Medications    Prior to Admission medications   Medication Sig Start Date End Date Taking? Authorizing Provider  cephALEXin (KEFLEX) 500 MG capsule Take 1 capsule (500 mg total) by mouth 4 (four) times daily for 7 days. 08/18/19 08/25/19  Maxwell Caul, PA-C  diphenhydrAMINE (BENADRYL) 25 mg capsule Take 50 mg by mouth at bedtime as needed for allergies.    [provider]  ibuprofen (ADVIL,MOTRIN) 200 MG tablet Take 600 mg by mouth every 6 (six) hours as needed for moderate pain (tooth ache).    [provider]  metroNIDAZOLE (FLAGYL) 500 MG tablet Take 1 tablet (500 mg total) by mouth 2 (two) times daily. 08/18/19   Maxwell Caul, PA-C    Family History Family History  Problem  Relation Age of Onset   Hypertension Mother    Diabetes Mother    Heart disease Mother    Heart disease Father    Other Neg Hx     Social History Social History   Tobacco Use   Smoking status: Current Every Day Smoker    Packs/day: 0.25    Years: 3.00    Pack years: 0.75    Types: Cigarettes    Last attempt to quit: 09/25/2013    Years since quitting: 5.8   Smokeless tobacco: Never Used  Substance Use Topics   Alcohol use: Yes    Comment: occasionally   Drug use: No     Allergies   Patient has no known allergies.   Review of Systems Review of Systems  Constitutional: Negative for fever.  Cardiovascular: Negative for chest pain.  Gastrointestinal: Positive for abdominal pain. Negative for nausea and vomiting.  Genitourinary: Positive for dysuria and frequency. Negative  for hematuria, vaginal bleeding and vaginal discharge.  All other systems reviewed and are negative.    Physical Exam Updated Vital Signs BP (!) 143/84 (BP Location: Right Arm)    Pulse 96    Temp 99 F (37.2 C) (Oral)    Resp 16    LMP 07/23/2019    SpO2 99%   Physical Exam Vitals signs and nursing note reviewed. Exam conducted with a chaperone present.  Constitutional:      Appearance: She is well-developed.  HENT:     Head: Normocephalic and atraumatic.  Eyes:     General: No scleral icterus.       Right eye: No discharge.        Left eye: No discharge.     Conjunctiva/sclera: Conjunctivae normal.  Pulmonary:     Effort: Pulmonary effort is normal.  Abdominal:     Tenderness: There is abdominal tenderness in the left lower quadrant. There is left CVA tenderness. There is no guarding or rebound.     Comments: Abdomen soft, nondistended.  Diffuse tenderness of the left lower quadrant and suprapubic region.  Left-sided CVA tenderness noted.  No rigidity, guarding.  No peritoneal signs.  Genitourinary:    Vagina: Vaginal discharge present.     Cervix: No cervical motion tenderness.     Adnexa:        Right: No mass or tenderness.         Left: No mass or tenderness.       Comments: The exam was performed with a chaperone present. Normal external female genitalia. No lesions, rash, or sores.  Thick white discharge in vaginal vault.  Partial visualization of cervix secondary to body habitus.  No obvious cervical abnormality.  No CMT.  No adnexal mass or tenderness noted bilaterally. Skin:    General: Skin is warm and dry.  Neurological:     Mental Status: She is alert.  Psychiatric:        Speech: Speech normal.        Behavior: Behavior normal.      ED Treatments / Results  Labs (all labs ordered are listed, but only abnormal results are displayed) Labs Reviewed  WET PREP, GENITAL - Abnormal; Notable for the following components:      Result Value   Clue Cells Wet Prep  HPF POC PRESENT (*)    WBC, Wet Prep HPF POC FEW (*)    All other components within normal limits  URINALYSIS, ROUTINE W REFLEX MICROSCOPIC - Abnormal; Notable for the following components:  APPearance TURBID (*)    Specific Gravity, Urine >1.030 (*)    Bilirubin Urine SMALL (*)    Ketones, ur 15 (*)    Nitrite POSITIVE (*)    Leukocytes,Ua TRACE (*)    All other components within normal limits  URINALYSIS, MICROSCOPIC (REFLEX) - Abnormal; Notable for the following components:   Bacteria, UA FEW (*)    All other components within normal limits  URINE CULTURE  PREGNANCY, URINE  HIV ANTIBODY (ROUTINE TESTING W REFLEX)  RPR  GC/CHLAMYDIA PROBE AMP ([place]) NOT AT [facility]    EKG None  Radiology No results found.  Procedures Procedures (including critical care time)  Medications Ordered in ED Medications  cefTRIAXone (ROCEPHIN) injection 250 mg (250 mg Intramuscular Given 08/18/19 1704)  azithromycin (ZITHROMAX) tablet 1,000 mg (1,000 mg Oral Given 08/18/19 1704)  sterile water (preservative free) injection (0.9 mLs  Given 08/18/19 1706)     Initial Impression / Assessment and Plan / ED Course  I have reviewed the triage vital signs and the nursing notes.  Pertinent labs & imaging results that were available during my care of the patient were reviewed by me and considered in my medical decision making (see chart for details).        33 year old female who presents for evaluation of urinary complaints as well as some left lower quadrant and left back pain.  No fevers, nausea/vomiting.  Also concern about possible STD exposure.  Initially arrival, she is afebrile, nontoxic-appearing, sitting calmly on bed with no signs of distress.  Vital signs are stable.  On exam, she has some mild left lower quadrant abdominal tenderness left-sided CVA tenderness.  She is otherwise well-appearing and is sitting on her phone with no signs of acute distress.  Concern for GU etiology such as  acute urinary tract infection versus pyelonephritis.  Will plan for urine, pelvic exam.  I discussed STD options with patient.  She states that her boyfriend was treated but had tested negative.  She is concerned and states that she would like to be tested for all STDs.  UA is positive for nitrites, leukocytes.  Will plan for treatment as well as culture.  Urine pregnancy negative.  Pelvic exam as documented above.  Partial visualization of cervix secondary to body habitus. No CMT that would be concerning for PID.  No adnexal mass or tenderness noted bilaterally.  She did have some thick white discharge noted in the vaginal vault.  I discussed treatment options with patient.  She would like to be treated for STDs today.  Patient with no known drug allergies.  Wet prep is positive for clue cells.  We will plan to treat accordingly.  Patient is hemodynamically stable and is able to tolerate p.o. without any difficulty.  She is nontoxic-appearing.  She is stable for home treatment.  Encouraged at home supportive care measures. At this time, patient exhibits no emergent life-threatening condition that require further evaluation in ED or admission. I went to discuss patient's treatment and she was not in the room.  I did send her prescriptions but I think patient left prior to getting her discharge papers.  Portions of this note were generated with L[employer]. Dictation errors may occur despite best attempts at proofreading.   Final Clinical Impressions(s) / ED Diagnoses   Final diagnoses:  Acute cystitis without hematuria  Bacterial vaginosis    ED Discharge Orders         Ordered    metroNIDAZOLE (FLAGYL) 500  MG tablet  2 times daily     08/18/19 1718    cephALEXin (KEFLEX) 500 MG capsule  4 times daily     08/18/19 1718           Maxwell CaulLayden, Hannalee Castor A, PA-C 08/18/19 1951    Melene PlanFloyd, Dan, DO 08/19/19 1535

## 2019-08-18 NOTE — Discharge Instructions (Signed)
Take Keflex for UTI and Flagyl for BV.  Take Flagyl as directed.  It is very important that you do not consume any alcohol while taking this medication as it will cause you to become violently ill.  As we discussed today, you have gonorrhea/chlamydia cultures pending as well as HIV and syphilis test pending.  You have been treated today prophylactically for gonorrhea and chlamydia.  The results come back positive for gonorrhea or chlamydia, there is nothing more that you need to do.  If you are positive for HIV or syphilis, you will be directed on treatment.  You should not have any intercourse for the next 7 days.  If you are positive, your partner needs to be tested and treated.  Return emergency department for any fevers, worsening abdominal pain, inability to eat or drink, vomiting or any other worsening concerning symptoms.

## 2019-08-19 LAB — RPR: RPR Ser Ql: NONREACTIVE

## 2019-08-20 LAB — URINE CULTURE
Culture: >=100000 — AB
Special Requests: NORMAL

## 2019-08-20 LAB — GC/CHLAMYDIA PROBE AMP (~~LOC~~) NOT AT ARMC
Chlamydia: NEGATIVE
Neisseria Gonorrhea: NEGATIVE

## 2019-08-21 ENCOUNTER — Telehealth: Payer: Self-pay

## 2019-08-21 NOTE — Telephone Encounter (Signed)
Post ED Visit - Positive Culture Follow-up  Culture report reviewed by antimicrobial stewardship pharmacist: [place] Team []  Elenor Quinones, Pharm.D. []  Heide Guile, Pharm.D., BCPS AQ-ID []  Parks Neptune, Pharm.D., BCPS []  Alycia Rossetti, Pharm.D., BCPS []  [place], Pharm.D., BCPS, AAHIVP []  Legrand Como, Pharm.D., BCPS, AAHIVP []  Salome Arnt, PharmD, BCPS []  Johnnette Gourd, PharmD, BCPS []  Hughes Better, PharmD, BCPS []  Leeroy Cha, PharmD []  Laqueta Linden, PharmD, BCPS []  Albertina Parr, PharmD [place] Team []  Leodis Sias, PharmD []  Lindell Spar, PharmD []  Royetta Asal, PharmD []  Graylin Shiver, Rph []  Rema Fendt) Glennon Mac, PharmD []  Arlyn Dunning, PharmD []  Netta Cedars, PharmD []  Dia Sitter, PharmD []  Leone Haven, PharmD []  Gretta Arab, PharmD []  Theodis Shove, PharmD []  Peggyann Juba, PharmD []  Reuel Boom, PharmD   Positive urine culture Treated with Cephalexin, organism sensitive to the same and no further patient follow-up is required at this time.  Genia Del 08/21/2019, 9:37 AM

## 2019-09-02 ENCOUNTER — Emergency Department (HOSPITAL_COMMUNITY)
Admission: EM | Admit: 2019-09-02 | Discharge: 2019-09-02 | Disposition: A | Discharge status: Home / Self Care | Payer: Self-pay | Attending: Emergency Medicine | Admitting: Emergency Medicine

## 2019-09-02 ENCOUNTER — Emergency Department (HOSPITAL_COMMUNITY): Payer: Self-pay

## 2019-09-02 DIAGNOSIS — Z23 Encounter for immunization: Secondary | ICD-10-CM | POA: Insufficient documentation

## 2019-09-02 DIAGNOSIS — Y92009 Unspecified place in unspecified non-institutional (private) residence as the place of occurrence of the external cause: Secondary | ICD-10-CM | POA: Insufficient documentation

## 2019-09-02 DIAGNOSIS — R110 Nausea: Secondary | ICD-10-CM | POA: Insufficient documentation

## 2019-09-02 DIAGNOSIS — R000 Tachycardia, unspecified: Secondary | ICD-10-CM | POA: Insufficient documentation

## 2019-09-02 DIAGNOSIS — F17210 Nicotine dependence, cigarettes, uncomplicated: Secondary | ICD-10-CM | POA: Insufficient documentation

## 2019-09-02 DIAGNOSIS — Z79899 Other long term (current) drug therapy: Secondary | ICD-10-CM | POA: Insufficient documentation

## 2019-09-02 DIAGNOSIS — S0181XA Laceration without foreign body of other part of head, initial encounter: Secondary | ICD-10-CM

## 2019-09-02 DIAGNOSIS — Y9389 Activity, other specified: Secondary | ICD-10-CM | POA: Insufficient documentation

## 2019-09-02 DIAGNOSIS — R030 Elevated blood-pressure reading, without diagnosis of hypertension: Secondary | ICD-10-CM | POA: Insufficient documentation

## 2019-09-02 DIAGNOSIS — S0083XA Contusion of other part of head, initial encounter: Secondary | ICD-10-CM

## 2019-09-02 DIAGNOSIS — Y999 Unspecified external cause status: Secondary | ICD-10-CM | POA: Insufficient documentation

## 2019-09-02 DIAGNOSIS — R5383 Other fatigue: Secondary | ICD-10-CM | POA: Insufficient documentation

## 2019-09-02 DIAGNOSIS — R519 Headache, unspecified: Secondary | ICD-10-CM | POA: Insufficient documentation

## 2019-09-02 MED ORDER — TETANUS-DIPHTH-ACELL PERTUSSIS 5-2.5-18.5 LF-MCG/0.5 IM SUSP
0.5000 mL | Freq: Once | INTRAMUSCULAR | Status: AC
  Administered 2019-09-02: 0.5 mL via INTRAMUSCULAR
  Filled 2019-09-02: qty 0.5

## 2019-09-02 NOTE — ED Notes (Signed)
Pt verbalized understanding of discharge instructions. Follow up care reviewed, no further questions at this time.
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
## 2019-09-02 NOTE — Discharge Instructions (Signed)
Keep wound clean and dry Glue will fall off in about a week. Do not scrub or put any lotion or ointment on it Return if worse
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
## 2019-12-17 ENCOUNTER — Emergency Department (HOSPITAL_COMMUNITY)
Admission: EM | Admit: 2019-12-17 | Discharge: 2019-12-18 | Disposition: A | Discharge status: Home / Self Care | Payer: Self-pay | Attending: Emergency Medicine | Admitting: Emergency Medicine

## 2019-12-17 ENCOUNTER — Other Ambulatory Visit: Payer: Self-pay

## 2019-12-17 ENCOUNTER — Encounter (HOSPITAL_COMMUNITY): Payer: Self-pay | Admitting: Emergency Medicine

## 2019-12-17 DIAGNOSIS — Z5321 Procedure and treatment not carried out due to patient leaving prior to being seen by health care provider: Secondary | ICD-10-CM | POA: Insufficient documentation

## 2019-12-17 DIAGNOSIS — R519 Headache, unspecified: Secondary | ICD-10-CM | POA: Insufficient documentation

## 2019-12-18 ENCOUNTER — Emergency Department (HOSPITAL_COMMUNITY): Payer: Self-pay
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
## 2024-01-06 ENCOUNTER — Other Ambulatory Visit: Payer: Self-pay

## 2024-01-06 ENCOUNTER — Emergency Department (HOSPITAL_COMMUNITY): Admission: EM | Admit: 2024-01-06 | Discharge: 2024-01-06 | Disposition: A | Discharge status: Home / Self Care

## 2024-01-06 ENCOUNTER — Encounter (HOSPITAL_COMMUNITY): Payer: Self-pay

## 2024-01-06 DIAGNOSIS — K529 Noninfective gastroenteritis and colitis, unspecified: Secondary | ICD-10-CM | POA: Diagnosis not present

## 2024-01-06 DIAGNOSIS — E669 Obesity, unspecified: Secondary | ICD-10-CM | POA: Diagnosis not present

## 2024-01-06 DIAGNOSIS — R112 Nausea with vomiting, unspecified: Secondary | ICD-10-CM | POA: Diagnosis present

## 2024-01-06 DIAGNOSIS — Z6841 Body Mass Index (BMI) 40.0 and over, adult: Secondary | ICD-10-CM | POA: Insufficient documentation

## 2024-01-06 LAB — COMPREHENSIVE METABOLIC PANEL WITH GFR
ALT: 19 U/L (ref 0–44)
AST: 26 U/L (ref 15–41)
Albumin: 3.5 g/dL (ref 3.5–5.0)
Alkaline Phosphatase: 49 U/L (ref 38–126)
Anion gap: 9 (ref 5–15)
BUN: 11 mg/dL (ref 6–20)
CO2: 24 mmol/L (ref 22–32)
Calcium: 8.5 mg/dL — ABNORMAL LOW (ref 8.9–10.3)
Chloride: 102 mmol/L (ref 98–111)
Creatinine, Ser: 0.89 mg/dL (ref 0.44–1.00)
GFR, Estimated: >60 mL/min (ref >60)
Glucose, Bld: 91 mg/dL (ref 70–99)
Potassium: 4 mmol/L (ref 3.5–5.1)
Sodium: 135 mmol/L (ref 135–145)
Total Bilirubin: 0.5 mg/dL (ref 0.0–1.2)
Total Protein: 6.9 g/dL (ref 6.5–8.1)

## 2024-01-06 LAB — CBC WITH DIFFERENTIAL/PLATELET
Abs Immature Granulocytes: 0.01 10*3/uL (ref 0.00–0.07)
Basophils Absolute: 0 10*3/uL (ref 0.0–0.1)
Basophils Relative: 1 %
Eosinophils Absolute: 0.2 10*3/uL (ref 0.0–0.5)
Eosinophils Relative: 3 %
HCT: 35.2 % — ABNORMAL LOW (ref 36.0–46.0)
Hemoglobin: 11.1 g/dL — ABNORMAL LOW (ref 12.0–15.0)
Immature Granulocytes: 0 %
Lymphocytes Relative: 28 %
Lymphs Abs: 1.5 10*3/uL (ref 0.7–4.0)
MCH: 25.5 pg — ABNORMAL LOW (ref 26.0–34.0)
MCHC: 31.5 g/dL (ref 30.0–36.0)
MCV: 80.7 fL (ref 80.0–100.0)
Monocytes Absolute: 0.5 10*3/uL (ref 0.1–1.0)
Monocytes Relative: 8 %
Neutro Abs: 3.3 10*3/uL (ref 1.7–7.7)
Neutrophils Relative %: 60 %
Platelets: 267 10*3/uL (ref 150–400)
RBC: 4.36 MIL/uL (ref 3.87–5.11)
RDW: 17.6 % — ABNORMAL HIGH (ref 11.5–15.5)
WBC: 5.5 10*3/uL (ref 4.0–10.5)
nRBC: 0 % (ref 0.0–0.2)

## 2024-01-06 LAB — HCG, SERUM, QUALITATIVE: Preg, Serum: NEGATIVE

## 2024-01-06 LAB — RESP PANEL BY RT-PCR (RSV, FLU A&B, COVID)  RVPGX2
Influenza A by PCR: NEGATIVE
Influenza B by PCR: NEGATIVE
Resp Syncytial Virus by PCR: NEGATIVE
SARS Coronavirus 2 by RT PCR: NEGATIVE

## 2024-01-06 LAB — LIPASE, BLOOD: Lipase: 22 U/L (ref 11–51)

## 2024-01-06 MED ORDER — ONDANSETRON 4 MG PO TBDP: 4.0000 mg | ORAL_TABLET | Freq: Three times a day (TID) | ORAL | 0 refills | Status: AC | PRN

## 2024-01-06 MED ORDER — ONDANSETRON HCL 4 MG/2ML IJ SOLN
4.0000 mg | Freq: Once | INTRAMUSCULAR | Status: AC
  Administered 2024-01-06: 4 mg via INTRAVENOUS
  Filled 2024-01-06: qty 2

## 2024-01-06 NOTE — Discharge Instructions (Signed)
 As discussed he may take over-the-counter Tylenol with ibuprofen for generalized pain and bodyaches.  Continue to maintain hydration with frequent sips of clear liquids or low sugar Gatorade.  We are prescribing you Zofran as needed for nausea and vomiting.  Return immediately for fevers, chills, chest pain, shortness of breath, worsening abdominal pain inability to eat or drink due to nausea vomiting, stop having bowel movements or any new or worsening symptoms that are concerning to you.

## 2024-01-06 NOTE — ED Triage Notes (Signed)
 Pt c/o N/V/D, and HAx3d
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
## 2024-02-25 ENCOUNTER — Emergency Department (HOSPITAL_COMMUNITY)
Admission: EM | Admit: 2024-02-25 | Discharge: 2024-02-25 | Disposition: A | Discharge status: Home / Self Care | Attending: Emergency Medicine | Admitting: Emergency Medicine

## 2024-02-25 ENCOUNTER — Other Ambulatory Visit: Payer: Self-pay

## 2024-02-25 DIAGNOSIS — K0889 Other specified disorders of teeth and supporting structures: Secondary | ICD-10-CM | POA: Insufficient documentation

## 2024-02-25 MED ORDER — IBUPROFEN 400 MG PO TABS
400.0000 mg | ORAL_TABLET | Freq: Once | ORAL | Status: AC | PRN
  Administered 2024-02-25: 400 mg via ORAL
  Filled 2024-02-25: qty 1

## 2024-02-25 MED ORDER — OXYCODONE HCL 5 MG PO TABS: 5.0000 mg | ORAL_TABLET | ORAL | 0 refills | Status: AC | PRN

## 2024-02-25 MED ORDER — AMOXICILLIN-POT CLAVULANATE 875-125 MG PO TABS: 1.0000 | ORAL_TABLET | Freq: Two times a day (BID) | ORAL | 0 refills | Status: DC

## 2024-02-25 NOTE — ED Triage Notes (Signed)
 Pt having left sided upper and lower dental pain for 2 weeks. Pt has taken OTC meds with no relief. Was told by dentist to come in for ABX

## 2024-02-25 NOTE — ED Provider Notes (Signed)
 [place] EMERGENCY DEPARTMENT AT [facility] Provider Note   CSN: [id number] Arrival date & time: 02/25/24  1112     History  Chief Complaint  Patient presents with   Dental Pain    Robin Hodge is a 38 y.o. female.  38 year old female with a history of dental caries who presents to the emergency department with left-sided mouth pain.  Patient reports that for the past few weeks she has been having pain in her left upper and lower molars.  Also noticed a little bit of swelling there.  Called the dentist but told her to come to the emergency department to be seen first.         Home Medications Prior to Admission medications   Medication Sig Start Date End Date Taking? Authorizing Provider  amoxicillin -clavulanate (AUGMENTIN ) 875-125 MG tablet Take 1 tablet by mouth every 12 (twelve) hours. 02/25/24  Yes Ninetta Basket, MD  oxyCODONE  (ROXICODONE ) 5 MG immediate release tablet Take 1 tablet (5 mg total) by mouth every 4 (four) hours as needed. 02/25/24  Yes Ninetta Basket, MD  diphenhydrAMINE  (BENADRYL ) 25 mg capsule Take 50 mg by mouth at bedtime as needed for allergies.    [provider]  ibuprofen  (ADVIL ,MOTRIN ) 200 MG tablet Take 600 mg by mouth every 6 (six) hours as needed for moderate pain (tooth ache).    [provider]  metroNIDAZOLE  (FLAGYL ) 500 MG tablet Take 1 tablet (500 mg total) by mouth 2 (two) times daily. 08/18/19   Valla Gauss, PA-C  ondansetron  (ZOFRAN -ODT) 4 MG disintegrating tablet Take 1 tablet (4 mg total) by mouth every 8 (eight) hours as needed for nausea or vomiting. 01/06/24   Rolinda Climes, DO      Allergies    Patient has no known allergies.    Review of Systems   Review of Systems  Physical Exam Updated Vital Signs BP (!) 156/94 (BP Location: Right Arm)   Pulse 85   Temp 98.8 F (37.1 C)   Resp 19   Ht 5\' 7"  (1.702 m)   Wt (!) 150 kg   LMP 02/07/2024   SpO2 100%   BMI 51.79 kg/m  Physical  Exam HENT:     Head: Normocephalic and atraumatic.     Jaw: There is normal jaw occlusion. No swelling.     Salivary Glands: Right salivary gland is not diffusely enlarged. Left salivary gland is not diffusely enlarged.     Mouth/Throat:      Comments: No gingival abscesses palpated.  No brawny edema under the tongue.  No elevation of the tongue.  Uvula midline.  No swelling within the mouth.  Tolerating her secretions.  Pulmonary:     Breath sounds: No stridor.     ED Results / Procedures / Treatments   Labs (all labs ordered are listed, but only abnormal results are displayed) Labs Reviewed - No data to display  EKG None  Radiology No results found.  Procedures Procedures    Medications Ordered in ED Medications  ibuprofen  (ADVIL ) tablet 400 mg (400 mg Oral Given 02/25/24 1155)    ED Course/ Medical Decision Making/ A&P                                 Medical Decision Making Risk Prescription drug management.   Robin Hodge is a 38 y.o. female with comorbidities that complicate the patient evaluation  including dental caries who presents to the emergency department with left-sided mouth pain.   Initial Ddx:  Dental carry, periapical abscess, Ludwig's angina, deep space abscess  MDM/Course:  Patient presents to the emergency department with painful teeth.  Does have a cracked tooth with tenderness to palpation.  Also reports that she was having some swelling on that side but no objective swelling at this time.  No abscesses were palpated.  No signs of Ludwig's angina.  Will give her antibiotics and have her follow-up with dentistry.    This patient presents to the ED for concern of complaints listed in HPI, this involves an extensive number of treatment options, and is a complaint that carries with it a high risk of complications and morbidity. Disposition including potential need for admission considered.   Dispo: DC Home. Return precautions discussed including,  but not limited to, those listed in the AVS. Allowed pt time to ask questions which were answered fully prior to dc.  Records reviewed Outpatient Clinic Notes I have reviewed the patients home medications and made adjustments as needed  Portions of this note were generated with Dragon dictation software. Dictation errors may occur despite best attempts at proofreading.     Final Clinical Impression(s) / ED Diagnoses Final diagnoses:  Pain, dental    Rx / DC Orders ED Discharge Orders          Ordered    amoxicillin -clavulanate (AUGMENTIN ) 875-125 MG tablet  Every 12 hours        02/25/24 1246    oxyCODONE  (ROXICODONE ) 5 MG immediate release tablet  Every 4 hours PRN        02/25/24 1246              Ninetta Basket, MD 02/25/24 1614

## 2024-02-25 NOTE — Discharge Instructions (Signed)
 You were seen for your to take in the emergency department.   At home, please take the biotics we prescribed you.  Take Tylenol  and ibuprofen  for your pain.You may also take the oxycodone  we have prescribed you for any breakthrough pain that may have.  Do not take this before driving or operating heavy machinery.  Do not take this medication with alcohol.    Check your MyChart online for the results of any tests that had not resulted by the time you left the emergency department.   Follow-up with your dentist in 2-3 days regarding your visit.    Return immediately to the emergency department if you experience any of the following: Difficulty breathing, difficulty swallowing, or any other concerning symptoms.    Thank you for visiting our Emergency Department. It was a pleasure taking care of you today.

## 2024-06-09 ENCOUNTER — Emergency Department (HOSPITAL_COMMUNITY)
Admission: EM | Admit: 2024-06-09 | Discharge: 2024-06-09 | Disposition: A | Discharge status: Home / Self Care | Attending: Emergency Medicine | Admitting: Emergency Medicine

## 2024-06-09 DIAGNOSIS — K029 Dental caries, unspecified: Secondary | ICD-10-CM | POA: Diagnosis not present

## 2024-06-09 DIAGNOSIS — K0889 Other specified disorders of teeth and supporting structures: Secondary | ICD-10-CM | POA: Diagnosis present

## 2024-06-09 MED ORDER — OXYCODONE-ACETAMINOPHEN 5-325 MG PO TABS
1.0000 | ORAL_TABLET | Freq: Once | ORAL | Status: AC
  Administered 2024-06-09: 1 via ORAL
  Filled 2024-06-09: qty 1

## 2024-06-09 MED ORDER — IBUPROFEN 600 MG PO TABS
600.0000 mg | ORAL_TABLET | Freq: Four times a day (QID) | ORAL | 0 refills | Status: AC | PRN
Start: 2024-06-09 — End: ?

## 2024-06-09 MED ORDER — AMOXICILLIN-POT CLAVULANATE 875-125 MG PO TABS: 1.0000 | ORAL_TABLET | Freq: Two times a day (BID) | ORAL | 0 refills | Status: AC

## 2024-06-09 NOTE — ED Provider Notes (Signed)
 [facility] EMERGENCY DEPARTMENT AT [facility] Provider Note   CSN: [id number] Arrival date & time: 06/09/24  1041     Patient presents with: Dental Pain   Robin Hodge is a 38 y.o. female reportedly otherwise healthy presents to the emergency department today for evaluation of left lower dental pain for the past week.  She reports she has had pain here before.  Has not seen a dentist in over a year.  She denies any fever, trouble swallowing, trouble breathing, face swelling, or neck swelling.  No medication tried prior to arrival.  No known drug allergies.  Dental Pain Associated symptoms: no facial swelling, no fever and no neck pain        Prior to Admission medications   Medication Sig Start Date End Date Taking? Authorizing Provider  amoxicillin -clavulanate (AUGMENTIN ) 875-125 MG tablet Take 1 tablet by mouth every 12 (twelve) hours. 02/25/24   Yolande Lamar BROCKS, MD  diphenhydrAMINE  (BENADRYL ) 25 mg capsule Take 50 mg by mouth at bedtime as needed for allergies.    [provider]  ibuprofen  (ADVIL ,MOTRIN ) 200 MG tablet Take 600 mg by mouth every 6 (six) hours as needed for moderate pain (tooth ache).    [provider]  metroNIDAZOLE  (FLAGYL ) 500 MG tablet Take 1 tablet (500 mg total) by mouth 2 (two) times daily. 08/18/19   Delorise Morna LABOR, PA-C  ondansetron  (ZOFRAN -ODT) 4 MG disintegrating tablet Take 1 tablet (4 mg total) by mouth every 8 (eight) hours as needed for nausea or vomiting. 01/06/24   Neysa Caron PARAS, DO  oxyCODONE  (ROXICODONE ) 5 MG immediate release tablet Take 1 tablet (5 mg total) by mouth every 4 (four) hours as needed. 02/25/24   Yolande Lamar BROCKS, MD    Allergies: Patient has no known allergies.    Review of Systems  Constitutional:  Negative for chills and fever.  HENT:  Positive for dental problem. Negative for facial swelling and trouble swallowing.   Respiratory:  Negative for shortness of breath.   Musculoskeletal:   Negative for neck pain.    Updated Vital Signs BP (!) 162/99 (BP Location: Right Arm)   Pulse 76   Temp 98.4 F (36.9 C)   Resp 20   SpO2 98%   Physical Exam Vitals and nursing note reviewed.  Constitutional:      General: She is not in acute distress.    Appearance: She is not ill-appearing or toxic-appearing.     Comments: On phone in no acute distress  HENT:     Mouth/Throat:     Mouth: Mucous membranes are moist.     Comments: Moist mucous membranes.  Uvula midline.  Airway patent.  No sublingual elevation.  Patient has tooth decay eroding past gumline for the left lower molars.  There is no palpable induration or fluctuance around the teeth.  No trismus.  Controlling secretions.  Normal phonation.  No facial swelling noted.  Nasolabial fold intact. Eyes:     General: No scleral icterus. Neck:     Comments: Symmetric, no swelling noted. Cardiovascular:     Rate and Rhythm: Normal rate.  Pulmonary:     Effort: Pulmonary effort is normal. No respiratory distress.     Breath sounds: No stridor.  Lymphadenopathy:     Cervical: No cervical adenopathy.  Skin:    General: Skin is warm and dry.  Neurological:     Mental Status: She is alert.     (all labs ordered are listed, but  only abnormal results are displayed) Labs Reviewed - No data to display  EKG: None  Radiology: No results found.  Procedures   Medications Ordered in the ED - No data to display   Medical Decision Making Risk Prescription drug management.   38 y/o F presents to the ER today for evaluation of dental pain. Differential diagnosis includes but is not limited to dental pain, dental caries, abscess, ludwig's angina. Vital signs elevated blood pressure otherwise normal. Physical exam as noted above.    Patient does have tooth erosion past gumline on the left lower molars, however there is no surrounding swelling, induration, or fluctuance.  No sublingual elevation.  No trismus.  Uvula is  midline airways patent.  She is speaking in full sentences.  No stridor.  Controlling secretions.  Her vital signs show elevated blood pressure but afebrile.  Not having any fevers at home.  Does not appear to be any deep infection, doubt Ludwick's.  Patient will be treated with Augmentin  and given information on dental resources and supportive care. Patient was given one time dose pain medication here as she was not driving and had a ride home.  Stable for discharge home.   We discussed plan at bedside. We discussed strict return precautions and red flag symptoms. The patient verbalized their understanding and agrees to the plan. The patient is stable and being discharged home in good condition.   Portions of this report may have been transcribed using voice recognition software. Every effort was made to ensure accuracy; however, inadvertent computerized transcription errors may be present.    Final diagnoses:  Dental caries    ED Discharge Orders          Ordered    amoxicillin -clavulanate (AUGMENTIN ) 875-125 MG tablet  Every 12 hours        06/09/24 1203    ibuprofen  (ADVIL ) 600 MG tablet  Every 6 hours PRN        06/09/24 1203               Bernis Ernst, PA-C 06/09/24 1212    Tegeler, Lonni PARAS, MD 06/09/24 [phone]

## 2024-06-09 NOTE — Discharge Instructions (Addendum)
 You were seen in the emergency department today for evaluation of your dental pain.  I have included additional information for you to review into the paperwork.  For pain, you should take 1000 mg of Tylenol  and/or 600 mg of ibuprofen  every 6 hours as needed.  I am also prescribing you an antibiotic to take.  You will take this twice daily for the next 7 days.  Please complete the entirety of the course.  Ultimately, you will need to see a dentist.  I included information for dentist in the area to this visit for work.  Please make sure you call to schedule an appointment.  Ultimately, you will need to have these teeth extracted.  If you have any concerns, new or worsening symptoms, please return to your nearest emergency department for reevaluation.  Contact a dental care provider if: You have dental pain and you don't know why. Your pain isn't controlled with medicines. Your symptoms get worse. You have new symptoms. Get help right away if: You can't open your mouth. You're having trouble breathing or swallowing. You have a fever. Your face, neck, or jaw is swollen. These symptoms may be an emergency. Call 911 right away. Do not wait to see if the symptoms will go away. Do not drive yourself to the hospital.

## 2024-06-09 NOTE — ED Triage Notes (Signed)
 C/o lower left jaw pain onset several days ago , states sh has a bad tooth.
